# Patient Record
Sex: Male | Born: 1965
Health system: Southern US, Community
[De-identification: ages and names within clinical notes are randomized; demographics above are authoritative.]

## PROBLEM LIST (undated history)

## (undated) DIAGNOSIS — I2699 Other pulmonary embolism without acute cor pulmonale: Secondary | ICD-10-CM

## (undated) DIAGNOSIS — I82409 Acute embolism and thrombosis of unspecified deep veins of unspecified lower extremity: Secondary | ICD-10-CM

## (undated) DIAGNOSIS — I10 Essential (primary) hypertension: Secondary | ICD-10-CM

## (undated) DIAGNOSIS — D6851 Activated protein C resistance: Secondary | ICD-10-CM

## (undated) DIAGNOSIS — G40909 Epilepsy, unspecified, not intractable, without status epilepticus: Secondary | ICD-10-CM

## (undated) HISTORY — DX: Activated protein C resistance: D68.51

## (undated) HISTORY — DX: Acute embolism and thrombosis of unspecified deep veins of unspecified lower extremity: I82.409

## (undated) HISTORY — DX: Essential (primary) hypertension: I10

## (undated) HISTORY — PX: TYMPANOPLASTY: SHX33

## (undated) HISTORY — DX: Epilepsy, unspecified, not intractable, without status epilepticus: G40.909

## (undated) HISTORY — DX: Other pulmonary embolism without acute cor pulmonale: I26.99

---

## 2017-04-22 LAB — HEPATIC FUNCTION PANEL
ALT: 40 (ref 10–40)
AST: 15 (ref 14–40)
Alkaline Phosphatase: 134 — AB (ref 25–125)
Bilirubin, Total: 0.3

## 2017-04-22 LAB — TSH: TSH: 1.67 (ref <=5.90)

## 2017-04-22 LAB — LIPID PANEL
Cholesterol: 207 — AB (ref 0–200)
HDL: 46 (ref 35–70)
LDL Cholesterol: 108
Triglycerides: 265 — AB (ref 40–160)

## 2017-04-22 LAB — CBC AND DIFFERENTIAL
HCT: 43 (ref 41–53)
Hemoglobin: 15 (ref 13.5–17.5)
Platelets: 207 (ref 150–399)
WBC: 6.6

## 2017-04-22 LAB — BASIC METABOLIC PANEL
Potassium: 4.1 (ref 3.4–5.3)
Sodium: 139 (ref 137–147)

## 2017-04-22 LAB — PSA: PSA: 1

## 2017-10-24 DIAGNOSIS — D689 Coagulation defect, unspecified: Secondary | ICD-10-CM | POA: Diagnosis not present

## 2017-10-28 DIAGNOSIS — D689 Coagulation defect, unspecified: Secondary | ICD-10-CM | POA: Diagnosis not present

## 2017-12-07 DIAGNOSIS — D689 Coagulation defect, unspecified: Secondary | ICD-10-CM | POA: Diagnosis not present

## 2018-01-11 DIAGNOSIS — Z7901 Long term (current) use of anticoagulants: Secondary | ICD-10-CM | POA: Diagnosis not present

## 2018-01-11 DIAGNOSIS — I10 Essential (primary) hypertension: Secondary | ICD-10-CM | POA: Diagnosis not present

## 2018-01-11 DIAGNOSIS — M1712 Unilateral primary osteoarthritis, left knee: Secondary | ICD-10-CM | POA: Diagnosis not present

## 2018-01-11 DIAGNOSIS — M7122 Synovial cyst of popliteal space [Baker], left knee: Secondary | ICD-10-CM | POA: Diagnosis not present

## 2018-03-15 DIAGNOSIS — Z86718 Personal history of other venous thrombosis and embolism: Secondary | ICD-10-CM | POA: Diagnosis not present

## 2018-03-15 DIAGNOSIS — Z114 Encounter for screening for human immunodeficiency virus [HIV]: Secondary | ICD-10-CM | POA: Diagnosis not present

## 2018-03-15 DIAGNOSIS — M25562 Pain in left knee: Secondary | ICD-10-CM | POA: Diagnosis not present

## 2018-03-15 DIAGNOSIS — Z0001 Encounter for general adult medical examination with abnormal findings: Secondary | ICD-10-CM | POA: Diagnosis not present

## 2018-03-15 DIAGNOSIS — I10 Essential (primary) hypertension: Secondary | ICD-10-CM | POA: Diagnosis not present

## 2018-03-15 DIAGNOSIS — E785 Hyperlipidemia, unspecified: Secondary | ICD-10-CM | POA: Diagnosis not present

## 2018-03-15 DIAGNOSIS — Z125 Encounter for screening for malignant neoplasm of prostate: Secondary | ICD-10-CM | POA: Diagnosis not present

## 2018-03-15 LAB — LIPID PANEL
Cholesterol: 228 — AB (ref 0–200)
HDL: 53 (ref 35–70)
LDL CALC: 124
Triglycerides: 255 — AB (ref 40–160)

## 2018-03-15 LAB — CBC AND DIFFERENTIAL
HEMATOCRIT: 44 (ref 41–53)
Hemoglobin: 14.9 (ref 13.5–17.5)
Platelets: 212 (ref 150–399)
WBC: 6.2

## 2018-03-15 LAB — HEPATIC FUNCTION PANEL
ALT: 44 — AB (ref 10–40)
AST: 19 (ref 14–40)
Alkaline Phosphatase: 146 — AB (ref 25–125)
Bilirubin, Total: 0.3

## 2018-03-15 LAB — BASIC METABOLIC PANEL
BUN: 12 (ref 4–21)
Creatinine: 0.9 (ref <=1.3)
Glucose: 86
Potassium: 4.2 (ref 3.4–5.3)
Sodium: 140 (ref 137–147)

## 2018-03-15 LAB — HIV ANTIBODY (ROUTINE TESTING W REFLEX): HIV 1&2 Ab, 4th Generation: NEGATIVE

## 2018-03-15 LAB — TSH: TSH: 1.54 (ref <=5.90)

## 2018-03-16 DIAGNOSIS — D689 Coagulation defect, unspecified: Secondary | ICD-10-CM | POA: Diagnosis not present

## 2018-04-24 DIAGNOSIS — D689 Coagulation defect, unspecified: Secondary | ICD-10-CM | POA: Diagnosis not present

## 2018-04-25 DIAGNOSIS — Z86718 Personal history of other venous thrombosis and embolism: Secondary | ICD-10-CM | POA: Diagnosis not present

## 2018-04-25 DIAGNOSIS — Z5181 Encounter for therapeutic drug level monitoring: Secondary | ICD-10-CM | POA: Diagnosis not present

## 2018-04-25 DIAGNOSIS — Z7901 Long term (current) use of anticoagulants: Secondary | ICD-10-CM | POA: Diagnosis not present

## 2018-04-26 DIAGNOSIS — H2513 Age-related nuclear cataract, bilateral: Secondary | ICD-10-CM | POA: Diagnosis not present

## 2018-04-26 DIAGNOSIS — H35033 Hypertensive retinopathy, bilateral: Secondary | ICD-10-CM | POA: Diagnosis not present

## 2018-04-26 DIAGNOSIS — H524 Presbyopia: Secondary | ICD-10-CM | POA: Diagnosis not present

## 2018-05-08 DIAGNOSIS — Z86711 Personal history of pulmonary embolism: Secondary | ICD-10-CM | POA: Diagnosis not present

## 2018-05-08 DIAGNOSIS — Z7901 Long term (current) use of anticoagulants: Secondary | ICD-10-CM | POA: Diagnosis not present

## 2018-05-08 DIAGNOSIS — Z5181 Encounter for therapeutic drug level monitoring: Secondary | ICD-10-CM | POA: Diagnosis not present

## 2018-05-08 DIAGNOSIS — Z86718 Personal history of other venous thrombosis and embolism: Secondary | ICD-10-CM | POA: Diagnosis not present

## 2018-05-09 DIAGNOSIS — M25662 Stiffness of left knee, not elsewhere classified: Secondary | ICD-10-CM | POA: Diagnosis not present

## 2018-05-09 DIAGNOSIS — M25562 Pain in left knee: Secondary | ICD-10-CM | POA: Diagnosis not present

## 2018-05-15 DIAGNOSIS — M7742 Metatarsalgia, left foot: Secondary | ICD-10-CM | POA: Diagnosis not present

## 2018-05-15 DIAGNOSIS — M2041 Other hammer toe(s) (acquired), right foot: Secondary | ICD-10-CM | POA: Diagnosis not present

## 2018-05-15 DIAGNOSIS — M7741 Metatarsalgia, right foot: Secondary | ICD-10-CM | POA: Diagnosis not present

## 2018-05-29 DIAGNOSIS — Z86718 Personal history of other venous thrombosis and embolism: Secondary | ICD-10-CM | POA: Diagnosis not present

## 2018-05-29 DIAGNOSIS — Z5181 Encounter for therapeutic drug level monitoring: Secondary | ICD-10-CM | POA: Diagnosis not present

## 2018-05-29 DIAGNOSIS — Z7901 Long term (current) use of anticoagulants: Secondary | ICD-10-CM | POA: Diagnosis not present

## 2018-06-29 DIAGNOSIS — Z7901 Long term (current) use of anticoagulants: Secondary | ICD-10-CM | POA: Diagnosis not present

## 2018-06-29 DIAGNOSIS — Z86711 Personal history of pulmonary embolism: Secondary | ICD-10-CM | POA: Diagnosis not present

## 2018-06-29 DIAGNOSIS — Z5181 Encounter for therapeutic drug level monitoring: Secondary | ICD-10-CM | POA: Diagnosis not present

## 2018-06-29 DIAGNOSIS — Z86718 Personal history of other venous thrombosis and embolism: Secondary | ICD-10-CM | POA: Diagnosis not present

## 2018-07-04 DIAGNOSIS — Z86711 Personal history of pulmonary embolism: Secondary | ICD-10-CM | POA: Diagnosis not present

## 2018-07-04 DIAGNOSIS — D6851 Activated protein C resistance: Secondary | ICD-10-CM | POA: Diagnosis not present

## 2018-07-04 DIAGNOSIS — Z7901 Long term (current) use of anticoagulants: Secondary | ICD-10-CM | POA: Diagnosis not present

## 2018-07-04 DIAGNOSIS — Z5181 Encounter for therapeutic drug level monitoring: Secondary | ICD-10-CM | POA: Diagnosis not present

## 2018-07-18 DIAGNOSIS — M2042 Other hammer toe(s) (acquired), left foot: Secondary | ICD-10-CM | POA: Diagnosis not present

## 2018-07-18 DIAGNOSIS — M2041 Other hammer toe(s) (acquired), right foot: Secondary | ICD-10-CM | POA: Diagnosis not present

## 2018-07-18 DIAGNOSIS — M7741 Metatarsalgia, right foot: Secondary | ICD-10-CM | POA: Diagnosis not present

## 2018-07-18 DIAGNOSIS — Z7901 Long term (current) use of anticoagulants: Secondary | ICD-10-CM | POA: Diagnosis not present

## 2018-07-18 DIAGNOSIS — M7742 Metatarsalgia, left foot: Secondary | ICD-10-CM | POA: Diagnosis not present

## 2018-07-18 DIAGNOSIS — Z5181 Encounter for therapeutic drug level monitoring: Secondary | ICD-10-CM | POA: Diagnosis not present

## 2018-07-18 DIAGNOSIS — Z86718 Personal history of other venous thrombosis and embolism: Secondary | ICD-10-CM | POA: Diagnosis not present

## 2018-08-10 DIAGNOSIS — Z7901 Long term (current) use of anticoagulants: Secondary | ICD-10-CM | POA: Diagnosis not present

## 2018-08-10 DIAGNOSIS — Z5181 Encounter for therapeutic drug level monitoring: Secondary | ICD-10-CM | POA: Diagnosis not present

## 2018-08-10 DIAGNOSIS — Z86718 Personal history of other venous thrombosis and embolism: Secondary | ICD-10-CM | POA: Diagnosis not present

## 2018-09-21 DIAGNOSIS — Z5181 Encounter for therapeutic drug level monitoring: Secondary | ICD-10-CM | POA: Diagnosis not present

## 2018-09-21 DIAGNOSIS — Z86718 Personal history of other venous thrombosis and embolism: Secondary | ICD-10-CM | POA: Diagnosis not present

## 2018-09-21 DIAGNOSIS — Z7901 Long term (current) use of anticoagulants: Secondary | ICD-10-CM | POA: Diagnosis not present

## 2018-10-16 ENCOUNTER — Ambulatory Visit: Payer: Self-pay | Admitting: Adult Health

## 2018-10-16 DIAGNOSIS — N529 Male erectile dysfunction, unspecified: Secondary | ICD-10-CM | POA: Diagnosis not present

## 2018-10-16 DIAGNOSIS — R569 Unspecified convulsions: Secondary | ICD-10-CM | POA: Diagnosis not present

## 2018-10-16 DIAGNOSIS — I10 Essential (primary) hypertension: Secondary | ICD-10-CM | POA: Diagnosis not present

## 2018-10-16 DIAGNOSIS — G40409 Other generalized epilepsy and epileptic syndromes, not intractable, without status epilepticus: Secondary | ICD-10-CM | POA: Diagnosis not present

## 2018-10-16 DIAGNOSIS — Z5181 Encounter for therapeutic drug level monitoring: Secondary | ICD-10-CM | POA: Diagnosis not present

## 2018-10-16 DIAGNOSIS — E785 Hyperlipidemia, unspecified: Secondary | ICD-10-CM | POA: Diagnosis not present

## 2018-10-16 DIAGNOSIS — Z7901 Long term (current) use of anticoagulants: Secondary | ICD-10-CM | POA: Diagnosis not present

## 2018-10-16 DIAGNOSIS — Z86718 Personal history of other venous thrombosis and embolism: Secondary | ICD-10-CM | POA: Diagnosis not present

## 2018-10-16 DIAGNOSIS — Z125 Encounter for screening for malignant neoplasm of prostate: Secondary | ICD-10-CM | POA: Diagnosis not present

## 2018-10-19 NOTE — Progress Notes (Signed)
Subjective:    Patient ID: Caleb Kirby, male    DOB: [birth year], 53 y.o.   MRN: [id number]  HPI:  Caleb Kirby is here to establish as a new pt.  He is a pleasant 53 year old male. PMH: HTN, HLD, Obesity, Factor V Leiden, Epilepsy, and Depression He served 8 years AD in [place] [employer] all chronic conditions managed by various [facility] providers. He reports 4 DVT in LLE and one PE in last 10 years. He has been on Xarelto and Lovenox in past, currently on Coumadin for anti-coagulation.  He is closely followed by [facility] Hematology monthly. His [facility] PCP manages HTN, HLD, Obesity, Epilepsy [facility] manages depression and PTSD r/t to [place] [employer]. He denies thoughts of harming himself/others He estimates to drink one beer/week He walks/rides bike "all day when I work at [employer]", >5K steps/day- M-F, however his 288 lns, Body mass index is 39.98 kg/m.  He reports nighttime snacking on CHO/sugar- last A1c per [facility] labs 2018- 5.4 He denies formal exercise program He denies tobacco/vape use He is agreeable to annual CPE and 6 month f/u, and will use use for acute issues- he will use established [facility] providers for chronic conditions He estimates to drink 50-60 oz plain water/day He is married, plans on retiring from [employer] in a few years.  Patient Care Team    Relationship Specialty Notifications Start End  Julaine Fusi, NP PCP - General Family Medicine  10/30/18     Patient Active Problem List   Diagnosis Date Noted  . HTN, goal below 130/80 10/30/2018  . Hypercholesteremia 10/30/2018  . Nonintractable epilepsy without status epilepticus (HCC) 10/30/2018  . Healthcare maintenance 10/30/2018  . Depression, recurrent (HCC) 10/30/2018  . BMI 39.0-39.9,adult 10/30/2018     Past Medical History:  Diagnosis Date  . DVT, lower extremity (HCC)   . Epilepsy (HCC)   . Factor V Leiden (HCC)   . Hypertension   . Pulmonary embolism [facility])      Past Surgical History:  Procedure Laterality Date  .  TYMPANOPLASTY       Family History  Problem Relation Age of Onset  . Heart attack Mother   . Alcohol abuse Father   . Cancer Sister        GI  . Cancer Brother        colon  . Cancer Maternal Grandmother        metastatic     Social History   Substance and Sexual Activity  Drug Use Not Currently     Social History   Substance and Sexual Activity  Alcohol Use Yes  . Alcohol/week: 1.0 standard drinks  . Types: 1 Cans of beer per week     Social History   Tobacco Use  Smoking Status Former Smoker  . Packs/day: 1.00  . Years: 20.00  . Pack years: 20.00  . Types: Cigarettes  . Last attempt to quit: 07/21/1997  . Years since quitting: 21.2  Smokeless Tobacco Never Used     Outpatient Encounter Medications as of 10/30/2018  Medication Sig  . atorvastatin (LIPITOR) 10 MG tablet Take 1 tablet by mouth daily.  . hydrochlorothiazide (HYDRODIURIL) 25 MG tablet Take 1 tablet by mouth daily.  Marland Kitchen losartan (COZAAR) 50 MG tablet Take 25 mg by mouth daily.  . phenytoin (DILANTIN) 200 MG ER capsule Take 200 mg by mouth 2 (two) times daily.  Marland Kitchen venlafaxine XR (EFFEXOR-XR) 150 MG 24 hr capsule Take 150 mg by  mouth daily with breakfast.  . warfarin (COUMADIN) 5 MG tablet Take 7.5 mg by mouth daily.   No facility-administered encounter medications on file as of 10/30/2018.     Allergies: Patient has no known allergies.  Body mass index is 39.98 kg/m.  Blood pressure 128/87, pulse 79, temperature 98.5 F (36.9 C), temperature source Oral, height 5' 11.25" (1.81 m), weight 288 lb 11.2 oz (131 kg), SpO2 96 %.  Review of Systems  Constitutional: Positive for fatigue. Negative for activity change, appetite change, chills, diaphoresis, fever and unexpected weight change.  HENT: Negative for congestion.   Eyes: Negative for visual disturbance.  Respiratory: Negative for cough, chest tightness, shortness of breath, wheezing and stridor.   Cardiovascular: Negative for chest pain,  palpitations and leg swelling.  Gastrointestinal: Negative for abdominal distention, abdominal pain, blood in stool, constipation, diarrhea, nausea and vomiting.  Genitourinary: Negative for difficulty urinating and flank pain.  Musculoskeletal: Negative for arthralgias, back pain, gait problem, joint swelling, myalgias, neck pain and neck stiffness.  Skin: Negative for color change, pallor, rash and wound.  Neurological: Negative for dizziness, seizures, weakness and headaches.  Hematological: Does not bruise/bleed easily.  Psychiatric/Behavioral: Negative for agitation, behavioral problems, confusion, decreased concentration, dysphoric mood, hallucinations, self-injury, sleep disturbance and suicidal ideas. The patient is not nervous/anxious and is not hyperactive.        Objective:   Physical Exam Vitals signs and nursing note reviewed.  Constitutional:      General: He is not in acute distress.    Appearance: He is obese. He is not ill-appearing, toxic-appearing or diaphoretic.  HENT:     Head: Normocephalic and atraumatic.  Cardiovascular:     Rate and Rhythm: Normal rate.     Pulses: Normal pulses.     Heart sounds: Normal heart sounds. No murmur. No friction rub. No gallop.   Pulmonary:     Effort: Pulmonary effort is normal. No respiratory distress.     Breath sounds: No stridor. No wheezing, rhonchi or rales.  Chest:     Chest wall: No tenderness.  Skin:    Capillary Refill: Capillary refill takes less than 2 seconds.  Neurological:     Mental Status: He is alert and oriented to person, place, and time.  Psychiatric:        Mood and Affect: Mood normal.        Behavior: Behavior normal.        Thought Content: Thought content normal.        Judgment: Judgment normal.       Assessment & Plan:   1. Hypercholesteremia   2. HTN, goal below 130/80   3. Nonintractable epilepsy without status epilepticus, unspecified epilepsy type (HCC)   4. Healthcare maintenance   5.  Depression, recurrent (HCC)   6. BMI 39.0-39.9,adult     Healthcare maintenance Continue all medications as directed. Continue regular follow-up with various VA providers. Increase water intake, strive for at least 100 ounces/day.   Follow Mediterranean diet Increase regular exercise.  Recommend at least 30 minutes daily, 5 days per week of walking, biking, swimming, YouTube/Pinterest workout videos. Please schedule complete physical in 3 months- do not need labs- have recent results from [place]. Please bring your colonoscopy results to physical.  HTN, goal below 130/80 BP at goal 128/87, HR 79 Continue Losartan 25mg  QD, HCTZ 25mg  QD  Hypercholesteremia Mediterranean Diet Increase regular exercise Continue Atorvastatin 10mg  QD  Depression, recurrent (HCC) Continue with quarterly VA mental health visits and  Venlafaxine 150mg  QD Denies thoughts of harming himself/others   Nonintractable epilepsy without status epilepticus (HCC) Denies any recent seizure activity Continue phenytoin 200mg  BID    FOLLOW-UP:  Return in about 3 months (around 01/28/2019) for CPE.

## 2018-10-26 DIAGNOSIS — R159 Full incontinence of feces: Secondary | ICD-10-CM | POA: Diagnosis not present

## 2018-10-26 DIAGNOSIS — Z86010 Personal history of colonic polyps: Secondary | ICD-10-CM | POA: Diagnosis not present

## 2018-10-26 DIAGNOSIS — Z800 Family history of malignant neoplasm of digestive organs: Secondary | ICD-10-CM | POA: Diagnosis not present

## 2018-10-26 DIAGNOSIS — R194 Change in bowel habit: Secondary | ICD-10-CM | POA: Diagnosis not present

## 2018-10-30 ENCOUNTER — Encounter: Payer: Self-pay | Admitting: Adult Health

## 2018-10-30 ENCOUNTER — Ambulatory Visit: Payer: Federal, State, Local not specified - PPO | Admitting: Adult Health

## 2018-10-30 VITALS — BP 128/87 | HR 79 | Temp 98.5°F | Ht 71.25 in | Wt 288.7 lb

## 2018-10-30 DIAGNOSIS — Z0000 Encounter for general adult medical examination without abnormal findings: Secondary | ICD-10-CM | POA: Diagnosis not present

## 2018-10-30 DIAGNOSIS — I10 Essential (primary) hypertension: Secondary | ICD-10-CM

## 2018-10-30 DIAGNOSIS — Z6839 Body mass index (BMI) 39.0-39.9, adult: Secondary | ICD-10-CM

## 2018-10-30 DIAGNOSIS — G40909 Epilepsy, unspecified, not intractable, without status epilepticus: Secondary | ICD-10-CM

## 2018-10-30 DIAGNOSIS — E7800 Pure hypercholesterolemia, unspecified: Secondary | ICD-10-CM | POA: Diagnosis not present

## 2018-10-30 DIAGNOSIS — F339 Major depressive disorder, recurrent, unspecified: Secondary | ICD-10-CM

## 2018-10-30 NOTE — Assessment & Plan Note (Signed)
Mediterranean Diet Increase regular exercise Continue Atorvastatin 10mg  QD

## 2018-10-30 NOTE — Assessment & Plan Note (Signed)
BP at goal 128/87, HR 79 Continue Losartan 25mg  QD, HCTZ 25mg  QD

## 2018-10-30 NOTE — Patient Instructions (Signed)
Mediterranean Diet A Mediterranean diet refers to food and lifestyle choices that are based on the traditions of countries located on the [employer]. This way of eating has been shown to help prevent certain conditions and improve outcomes for people who have chronic diseases, like kidney disease and heart disease. What are tips for following this plan? Lifestyle  Cook and eat meals together with your family, when possible.  Drink enough fluid to keep your urine clear or pale yellow.  Be physically active every day. This includes: ? Aerobic exercise like running or swimming. ? Leisure activities like gardening, walking, or housework.  Get 7-8 hours of sleep each night.  If recommended by your health care provider, drink red wine in moderation. This means 1 glass a day for nonpregnant women and 2 glasses a day for men. A glass of wine equals 5 oz (150 mL). Reading food labels   Check the serving size of packaged foods. For foods such as rice and pasta, the serving size refers to the amount of cooked product, not dry.  Check the total fat in packaged foods. Avoid foods that have saturated fat or trans fats.  Check the ingredients list for added sugars, such as corn syrup. Shopping  At the grocery store, buy most of your food from the areas near the walls of the store. This includes: ? Fresh fruits and vegetables (produce). ? Grains, beans, nuts, and seeds. Some of these may be available in unpackaged forms or large amounts (in bulk). ? Fresh seafood. ? Poultry and eggs. ? Low-fat dairy products.  Buy whole ingredients instead of prepackaged foods.  Buy fresh fruits and vegetables in-season from local farmers markets.  Buy frozen fruits and vegetables in resealable bags.  If you do not have access to quality fresh seafood, buy precooked frozen shrimp or canned fish, such as tuna, salmon, or sardines.  Buy small amounts of raw or cooked vegetables, salads, or olives from  the deli or salad bar at your store.  Stock your pantry so you always have certain foods on hand, such as olive oil, canned tuna, canned tomatoes, rice, pasta, and beans. Cooking  Cook foods with extra-virgin olive oil instead of using butter or other vegetable oils.  Have meat as a side dish, and have vegetables or grains as your main dish. This means having meat in small portions or adding small amounts of meat to foods like pasta or stew.  Use beans or vegetables instead of meat in common dishes like chili or lasagna.  Experiment with different cooking methods. Try roasting or broiling vegetables instead of steaming or sauteing them.  Add frozen vegetables to soups, stews, pasta, or rice.  Add nuts or seeds for added healthy fat at each meal. You can add these to yogurt, salads, or vegetable dishes.  Marinate fish or vegetables using olive oil, lemon juice, garlic, and fresh herbs. Meal planning   Plan to eat 1 vegetarian meal one day each week. Try to work up to 2 vegetarian meals, if possible.  Eat seafood 2 or more times a week.  Have healthy snacks readily available, such as: ? Vegetable sticks with hummus. ? [place] yogurt. ? Fruit and nut trail mix.  Eat balanced meals throughout the week. This includes: ? Fruit: 2-3 servings a day ? Vegetables: 4-5 servings a day ? Low-fat dairy: 2 servings a day ? Fish, poultry, or lean meat: 1 serving a day ? Beans and legumes: 2 or more servings a week ?  Nuts and seeds: 1-2 servings a day ? Whole grains: 6-8 servings a day ? Extra-virgin olive oil: 3-4 servings a day  Limit red meat and sweets to only a few servings a month What are my food choices?  Mediterranean diet ? Recommended ? Grains: Whole-grain pasta. Brown rice. Bulgar wheat. Polenta. Couscous. Whole-wheat bread. Orpah Cobb. ? Vegetables: Artichokes. Beets. Broccoli. Cabbage. Carrots. Eggplant. Green beans. Chard. Kale. Spinach. Onions. Leeks. Peas. Squash.  Tomatoes. Peppers. Radishes. ? Fruits: Apples. Apricots. Avocado. Berries. Bananas. Cherries. Dates. Figs. Grapes. Lemons. Melon. Oranges. Peaches. Plums. Pomegranate. ? Meats and other protein foods: Beans. Almonds. Sunflower seeds. Pine nuts. Peanuts. Cod. Salmon. Scallops. Shrimp. Tuna. Tilapia. Clams. Oysters. Eggs. ? Dairy: Low-fat milk. Cheese. Greek yogurt. ? Beverages: Water. Red wine. Herbal tea. ? Fats and oils: Extra virgin olive oil. Avocado oil. Grape seed oil. ? Sweets and desserts: [place] yogurt with honey. Baked apples. Poached pears. Trail mix. ? Seasoning and other foods: Basil. Cilantro. Coriander. Cumin. Mint. Parsley. Sage. Rosemary. Tarragon. Garlic. Oregano. Thyme. Pepper. Balsalmic vinegar. Tahini. Hummus. Tomato sauce. Olives. Mushrooms. ? Limit these ? Grains: Prepackaged pasta or rice dishes. Prepackaged cereal with added sugar. ? Vegetables: Deep fried potatoes (french fries). ? Fruits: Fruit canned in syrup. ? Meats and other protein foods: Beef. Pork. Lamb. Poultry with skin. Hot dogs. Tomasa Blase. ? Dairy: Ice cream. Sour cream. Whole milk. ? Beverages: Juice. Sugar-sweetened soft drinks. Beer. Liquor and spirits. ? Fats and oils: Butter. Canola oil. Vegetable oil. Beef fat (tallow). Lard. ? Sweets and desserts: Cookies. Cakes. Pies. Candy. ? Seasoning and other foods: Mayonnaise. Premade sauces and marinades. ? The items listed may not be a complete list. Talk with your dietitian about what dietary choices are right for you. Summary  The Mediterranean diet includes both food and lifestyle choices.  Eat a variety of fresh fruits and vegetables, beans, nuts, seeds, and whole grains.  Limit the amount of red meat and sweets that you eat.  Talk with your health care provider about whether it is safe for you to drink red wine in moderation. This means 1 glass a day for nonpregnant women and 2 glasses a day for men. A glass of wine equals 5 oz (150 mL). This information  is not intended to replace advice given to you by your health care provider. Make sure you discuss any questions you have with your health care provider. Document Released: 04/29/2016 Document Revised: 06/01/2016 Document Reviewed: 04/29/2016 Elsevier Interactive Patient Education  2019 [employer].  Continue all medications as directed. Continue regular follow-up with various VA providers. Increase water intake, strive for at least 100 ounces/day.   Follow Mediterranean diet Increase regular exercise.  Recommend at least 30 minutes daily, 5 days per week of walking, biking, swimming, YouTube/Pinterest workout videos. Please schedule complete physical in 3 months- do not need labs- have recent results from [place]. Please bring your colonoscopy results to physical. WELCOME TO THE PRACTICE!

## 2018-10-30 NOTE — Assessment & Plan Note (Signed)
Continue all medications as directed. Continue regular follow-up with various [facility] providers. Increase water intake, strive for at least 100 ounces/day.   Follow Mediterranean diet Increase regular exercise.  Recommend at least 30 minutes daily, 5 days per week of walking, biking, swimming, YouTube/Pinterest workout videos. Please schedule complete physical in 3 months- do not need labs- have recent results from [place]. Please bring your colonoscopy results to physical.

## 2018-10-30 NOTE — Assessment & Plan Note (Signed)
Continue with quarterly VA mental health visits and Venlafaxine 150mg  QD Denies thoughts of harming himself/others

## 2018-10-30 NOTE — Assessment & Plan Note (Signed)
Denies any recent seizure activity Continue phenytoin 200mg  BID

## 2018-11-21 DIAGNOSIS — Z5181 Encounter for therapeutic drug level monitoring: Secondary | ICD-10-CM | POA: Diagnosis not present

## 2018-11-21 DIAGNOSIS — Z86711 Personal history of pulmonary embolism: Secondary | ICD-10-CM | POA: Diagnosis not present

## 2018-11-21 DIAGNOSIS — D682 Hereditary deficiency of other clotting factors: Secondary | ICD-10-CM | POA: Diagnosis not present

## 2018-11-21 DIAGNOSIS — Z7901 Long term (current) use of anticoagulants: Secondary | ICD-10-CM | POA: Diagnosis not present

## 2018-12-04 ENCOUNTER — Encounter: Payer: Self-pay | Admitting: Adult Health

## 2018-12-04 ENCOUNTER — Ambulatory Visit: Payer: Federal, State, Local not specified - PPO | Admitting: Adult Health

## 2018-12-04 ENCOUNTER — Other Ambulatory Visit: Payer: Self-pay

## 2018-12-04 VITALS — BP 125/84 | HR 76 | Temp 98.9°F | Ht 71.25 in | Wt 292.3 lb

## 2018-12-04 DIAGNOSIS — H6121 Impacted cerumen, right ear: Secondary | ICD-10-CM | POA: Diagnosis not present

## 2018-12-04 NOTE — Progress Notes (Signed)
Subjective:    Patient ID: ACY OBARA, male    DOB: June 12, 1966, 53 y.o.   MRN: [id number]  HPI:  Ms. Leyton presents with R otalgia that began 3 weeks ago after swimming while on a cruise ship He denies acute injury/accident to R ear or R side of head He reports decreased hearing in R ear and ear pressure, pain rated 2/10 He has been using OTC Debriox He has hx of re-current excessive ear cerumen He had his R eardrum "replaced when I was age 78" He denies cough/nasal drainage He denies fever/night sweats/chills He denies dizziness/imbalance issues He estimates to drink 3-4 16 ox bottles of water He denies tobacco/vape use He reports waking with stiff neck and frontal HA this morning, both sx's were brief and have self resolved   Patient Care Team    Relationship Specialty Notifications Start End  Julaine Fusi, NP PCP - General Family Medicine  10/30/18     Patient Active Problem List   Diagnosis Date Noted  . Hearing loss of right ear due to cerumen impaction 12/04/2018  . HTN, goal below 130/80 10/30/2018  . Hypercholesteremia 10/30/2018  . Nonintractable epilepsy without status epilepticus (HCC) 10/30/2018  . Healthcare maintenance 10/30/2018  . Depression, recurrent (HCC) 10/30/2018  . BMI 39.0-39.9,adult 10/30/2018     Past Medical History:  Diagnosis Date  . DVT, lower extremity (HCC)   . Epilepsy (HCC)   . Factor V Leiden (HCC)   . Hypertension   . Pulmonary embolism [facility])      Past Surgical History:  Procedure Laterality Date  . TYMPANOPLASTY       Family History  Problem Relation Age of Onset  . Heart attack Mother   . Alcohol abuse Father   . Cancer Sister        GI  . Cancer Brother        colon  . Cancer Maternal Grandmother        metastatic     Social History   Substance and Sexual Activity  Drug Use Not Currently     Social History   Substance and Sexual Activity  Alcohol Use Yes  . Alcohol/week: 1.0 standard drinks  .  Types: 1 Cans of beer per week     Social History   Tobacco Use  Smoking Status Former Smoker  . Packs/day: 1.00  . Years: 20.00  . Pack years: 20.00  . Types: Cigarettes  . Last attempt to quit: 07/21/1997  . Years since quitting: 21.3  Smokeless Tobacco Never Used     Outpatient Encounter Medications as of 12/04/2018  Medication Sig  . atorvastatin (LIPITOR) 10 MG tablet Take 1 tablet by mouth daily.  . hydrochlorothiazide (HYDRODIURIL) 25 MG tablet Take 1 tablet by mouth daily.  Marland Kitchen losartan (COZAAR) 50 MG tablet Take 25 mg by mouth daily.  . phenytoin (DILANTIN) 200 MG ER capsule Take 200 mg by mouth 2 (two) times daily.  Marland Kitchen venlafaxine XR (EFFEXOR-XR) 150 MG 24 hr capsule Take 150 mg by mouth daily with breakfast.  . warfarin (COUMADIN) 5 MG tablet Take 7.5 mg by mouth daily.   No facility-administered encounter medications on file as of 12/04/2018.     Allergies: Patient has no known allergies.  Body mass index is 40.48 kg/m.  Blood pressure 125/84, pulse 76, temperature 98.9 F (37.2 C), temperature source Oral, height 5' 11.25" (1.81 m), weight 292 lb 4.8 oz (132.6 kg), SpO2 96 %.  Review of Systems  Constitutional: Positive for fatigue. Negative for activity change, appetite change, chills, diaphoresis, fever and unexpected weight change.  HENT: Positive for ear pain and hearing loss. Negative for congestion, ear discharge, facial swelling, postnasal drip, rhinorrhea, sinus pressure, sinus pain, sneezing, sore throat, trouble swallowing and voice change.   Eyes: Negative for visual disturbance.  Respiratory: Negative for cough, chest tightness, shortness of breath, wheezing and stridor.   Cardiovascular: Negative for chest pain, palpitations and leg swelling.  Endocrine: Negative for cold intolerance, heat intolerance, polydipsia, polyphagia and polyuria.  Neurological: Negative for dizziness and headaches.  Hematological: Does not bruise/bleed easily.        Objective:   Physical Exam Vitals signs and nursing note reviewed.  Constitutional:      General: He is not in acute distress.    Appearance: He is not ill-appearing, toxic-appearing or diaphoretic.  HENT:     Head: Normocephalic and atraumatic.     Right Ear: External ear normal. Decreased hearing noted. No drainage. There is impacted cerumen. No mastoid tenderness.     Left Ear: External ear normal. No decreased hearing noted. No drainage. No mastoid tenderness. Tympanic membrane is not erythematous or bulging.     Ears:     Comments: R ear- Unable to visualize TM- completely impacted with cerumen No pain with tragus movement  Pt unable to tolerate ear flush via Rhino Flush or curette  Mild cerumen noted noted in L ear     Nose: Nose normal.     Mouth/Throat:     Mouth: Mucous membranes are moist.     Pharynx: Posterior oropharyngeal erythema present. No oropharyngeal exudate.  Eyes:     Extraocular Movements: Extraocular movements intact.     Conjunctiva/sclera: Conjunctivae normal.     Pupils: Pupils are equal, round, and reactive to light.  Cardiovascular:     Rate and Rhythm: Normal rate.     Pulses: Normal pulses.     Heart sounds: Normal heart sounds. No murmur. No friction rub. No gallop.   Pulmonary:     Effort: Pulmonary effort is normal. No respiratory distress.     Breath sounds: Normal breath sounds. No stridor. No wheezing, rhonchi or rales.  Chest:     Chest wall: No tenderness.  Skin:    General: Skin is warm and dry.     Capillary Refill: Capillary refill takes less than 2 seconds.  Neurological:     Mental Status: He is alert and oriented to person, place, and time.  Psychiatric:        Mood and Affect: Mood normal.        Behavior: Behavior normal.        Thought Content: Thought content normal.        Judgment: Judgment normal.       Assessment & Plan:   1. Hearing loss of right ear due to cerumen impaction     Hearing loss of right ear due to  cerumen impaction Unable to tolerated  Continue to use OTC Debrox as directed by manufacturer. Referral to ENT placed for cerumen to be removed via suction device. Continue to drink plenty of water and avoiding tobacco products.    FOLLOW-UP:  Return if symptoms worsen or fail to improve.

## 2018-12-04 NOTE — Assessment & Plan Note (Signed)
Unable to tolerated  Continue to use OTC Debrox as directed by manufacturer. Referral to ENT placed for cerumen to be removed via suction device. Continue to drink plenty of water and avoiding tobacco products.

## 2018-12-04 NOTE — Patient Instructions (Signed)
Earwax Buildup, Adult The ears produce a substance called earwax that helps keep bacteria out of the ear and protects the skin in the ear canal. Occasionally, earwax can build up in the ear and cause discomfort or hearing loss. What increases the risk? This condition is more likely to develop in people who:  Are male.  Are elderly.  Naturally produce more earwax.  Clean their ears often with cotton swabs.  Use earplugs often.  Use in-ear headphones often.  Wear hearing aids.  Have narrow ear canals.  Have earwax that is overly thick or sticky.  Have eczema.  Are dehydrated.  Have excess hair in the ear canal. What are the signs or symptoms? Symptoms of this condition include:  Reduced or muffled hearing.  A feeling of fullness in the ear or feeling that the ear is plugged.  Fluid coming from the ear.  Ear pain.  Ear itch.  Ringing in the ear.  Coughing.  An obvious piece of earwax that can be seen inside the ear canal. How is this diagnosed? This condition may be diagnosed based on:  Your symptoms.  Your medical history.  An ear exam. During the exam, your health care provider will look into your ear with an instrument called an otoscope. You may have tests, including a hearing test. How is this treated? This condition may be treated by:  Using ear drops to soften the earwax.  Having the earwax removed by a health care provider. The health care provider may: ? Flush the ear with water. ? Use an instrument that has a loop on the end (curette). ? Use a suction device.  Surgery to remove the wax buildup. This may be done in severe cases. Follow these instructions at home:   Take over-the-counter and prescription medicines only as told by your health care provider.  Do not put any objects, including cotton swabs, into your ear. You can clean the opening of your ear canal with a washcloth or facial tissue.  Follow instructions from your health care  provider about cleaning your ears. Do not over-clean your ears.  Drink enough fluid to keep your urine clear or pale yellow. This will help to thin the earwax.  Keep all follow-up visits as told by your health care provider. If earwax builds up in your ears often or if you use hearing aids, consider seeing your health care provider for routine, preventive ear cleanings. Ask your health care provider how often you should schedule your cleanings.  If you have hearing aids, clean them according to instructions from the manufacturer and your health care provider. Contact a health care provider if:  You have ear pain.  You develop a fever.  You have blood, pus, or other fluid coming from your ear.  You have hearing loss.  You have ringing in your ears that does not go away.  Your symptoms do not improve with treatment.  You feel like the room is spinning (vertigo). Summary  Earwax can build up in the ear and cause discomfort or hearing loss.  The most common symptoms of this condition include reduced or muffled hearing and a feeling of fullness in the ear or feeling that the ear is plugged.  This condition may be diagnosed based on your symptoms, your medical history, and an ear exam.  This condition may be treated by using ear drops to soften the earwax or by having the earwax removed by a health care provider.  Do not put any   objects, including cotton swabs, into your ear. You can clean the opening of your ear canal with a washcloth or facial tissue. This information is not intended to replace advice given to you by your health care provider. Make sure you discuss any questions you have with your health care provider. Document Released: 10/14/2004 Document Revised: 08/18/2017 Document Reviewed: 11/17/2016 Elsevier Interactive Patient Education  2019 [employer].  Continue to use OTC Debrox as directed by manufacturer. Referral to ENT placed for cerumen to be removed via suction  device. Continue to drink plenty of water and avoiding tobacco products. NICE TO SEE YOU!

## 2018-12-07 DIAGNOSIS — Z5181 Encounter for therapeutic drug level monitoring: Secondary | ICD-10-CM | POA: Diagnosis not present

## 2018-12-07 DIAGNOSIS — Z7901 Long term (current) use of anticoagulants: Secondary | ICD-10-CM | POA: Diagnosis not present

## 2018-12-07 DIAGNOSIS — Z86718 Personal history of other venous thrombosis and embolism: Secondary | ICD-10-CM | POA: Diagnosis not present

## 2019-01-03 DIAGNOSIS — Z7901 Long term (current) use of anticoagulants: Secondary | ICD-10-CM | POA: Diagnosis not present

## 2019-01-03 DIAGNOSIS — D682 Hereditary deficiency of other clotting factors: Secondary | ICD-10-CM | POA: Diagnosis not present

## 2019-01-03 DIAGNOSIS — Z5181 Encounter for therapeutic drug level monitoring: Secondary | ICD-10-CM | POA: Diagnosis not present

## 2019-01-03 DIAGNOSIS — Z86711 Personal history of pulmonary embolism: Secondary | ICD-10-CM | POA: Diagnosis not present

## 2019-01-24 DIAGNOSIS — Z800 Family history of malignant neoplasm of digestive organs: Secondary | ICD-10-CM | POA: Diagnosis not present

## 2019-01-24 DIAGNOSIS — Z86010 Personal history of colonic polyps: Secondary | ICD-10-CM | POA: Diagnosis not present

## 2019-01-24 DIAGNOSIS — R195 Other fecal abnormalities: Secondary | ICD-10-CM | POA: Diagnosis not present

## 2019-01-29 ENCOUNTER — Telehealth: Payer: Self-pay

## 2019-01-29 NOTE — Telephone Encounter (Signed)
Pt called c/o cough with a tickle in his throat x 2 weeks.  Pt denies any fever or SOB.  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  Pt is inquiring about being tested for the coronavirus.  Advised pt that he does not qualify for testing, nor would he likely have the virus, since he has not had a fever nor SOB.  Advised pt that he may be suffering with allergies and should try OTC Zyrtec or Claritin and that if symptoms do not subside within a few days of using OTC meds, or if he develops fever and SOB associated with coughing, to call back.  Pt expressed understanding and is agreeable.  Tiajuana Amass, CMA

## 2019-02-14 ENCOUNTER — Ambulatory Visit: Payer: Federal, State, Local not specified - PPO | Admitting: Adult Health

## 2019-02-14 ENCOUNTER — Encounter: Payer: Self-pay | Admitting: Adult Health

## 2019-02-14 ENCOUNTER — Other Ambulatory Visit: Payer: Self-pay

## 2019-02-14 VITALS — BP 127/90 | HR 76 | Temp 98.3°F | Ht 71.25 in | Wt 286.4 lb

## 2019-02-14 DIAGNOSIS — R1031 Right lower quadrant pain: Secondary | ICD-10-CM | POA: Diagnosis not present

## 2019-02-14 DIAGNOSIS — R319 Hematuria, unspecified: Secondary | ICD-10-CM | POA: Insufficient documentation

## 2019-02-14 DIAGNOSIS — Z0000 Encounter for general adult medical examination without abnormal findings: Secondary | ICD-10-CM | POA: Diagnosis not present

## 2019-02-14 LAB — POCT URINALYSIS DIPSTICK
Bilirubin, UA: NEGATIVE
Glucose, UA: NEGATIVE
Ketones, UA: NEGATIVE
Leukocytes, UA: NEGATIVE
Nitrite, UA: NEGATIVE
Protein, UA: POSITIVE — AB
Spec Grav, UA: 1.025 (ref 1.010–1.025)
Urobilinogen, UA: 0.2 E.U./dL
pH, UA: 5.5 (ref 5.0–8.0)
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
## 2019-02-14 NOTE — Assessment & Plan Note (Addendum)
UA- Blood: Large ++ Referral to Urologist placed

## 2019-02-14 NOTE — Patient Instructions (Addendum)
Flank Pain, Adult Flank pain is pain that is located on the side of the body between the upper abdomen and the back. This area is called the flank. The pain may occur over a short period of time (acute), or it may be long-term or recurring (chronic). It may be mild or severe. Flank pain can be caused by many things, including:  Muscle soreness or injury.  Kidney stones or kidney disease.  Stress.  A disease of the spine (vertebral disk disease).  A lung infection (pneumonia).  Fluid around the lungs (pulmonary edema).  A skin rash caused by the chickenpox virus (shingles).  Tumors that affect the back of the abdomen.  Gallbladder disease. Follow these instructions at home:   Drink enough fluid to keep your urine clear or pale yellow.  Rest as told by your health care provider.  Take over-the-counter and prescription medicines only as told by your health care provider.  Keep a journal to track what has caused your flank pain and what has made it feel better.  Keep all follow-up visits as told by your health care provider. This is important. Contact a health care provider if:  Your pain is not controlled with medicine.  You have new symptoms.  Your pain gets worse.  You have a fever.  Your symptoms last longer than 2-3 days.  You have trouble urinating or you are urinating very frequently. Get help right away if:  You have trouble breathing or you are short of breath.  Your abdomen hurts or it is swollen or red.  You have nausea or vomiting.  You feel faint or you pass out.  You have blood in your urine. Summary  Flank pain is pain that is located on the side of the body between the upper abdomen and the back.  The pain may occur over a short period of time (acute), or it may be long-term or recurring (chronic). It may be mild or severe.  Flank pain can be caused by many things.  Contact your health care provider if your symptoms get worse or they last  longer than 2-3 days. This information is not intended to replace advice given to you by your health care provider. Make sure you discuss any questions you have with your health care provider. Document Released: 10/28/2005 Document Revised: 11/19/2016 Document Reviewed: 11/19/2016 Elsevier Interactive Patient Education  2019 [employer].  Urinalysis essentially normal with exception of blood, no acute infection. Referral placed to Urology. Increase water intake. Continue to social distance and wear a mask in public. NICE TO SEE YOU!

## 2019-02-14 NOTE — Assessment & Plan Note (Signed)
Urinalysis essentially normal with exception of blood, no acute infection. Referral placed to Urology. Increase water intake.

## 2019-02-14 NOTE — Assessment & Plan Note (Signed)
Continue to social distance and wear a mask in public.

## 2019-02-15 DIAGNOSIS — Z5181 Encounter for therapeutic drug level monitoring: Secondary | ICD-10-CM | POA: Diagnosis not present

## 2019-02-15 DIAGNOSIS — Z7901 Long term (current) use of anticoagulants: Secondary | ICD-10-CM | POA: Diagnosis not present

## 2019-02-15 DIAGNOSIS — Z86711 Personal history of pulmonary embolism: Secondary | ICD-10-CM | POA: Diagnosis not present

## 2019-02-15 DIAGNOSIS — Z86718 Personal history of other venous thrombosis and embolism: Secondary | ICD-10-CM | POA: Diagnosis not present

## 2019-02-19 DIAGNOSIS — N5201 Erectile dysfunction due to arterial insufficiency: Secondary | ICD-10-CM | POA: Diagnosis not present

## 2019-02-19 DIAGNOSIS — R311 Benign essential microscopic hematuria: Secondary | ICD-10-CM | POA: Diagnosis not present

## 2019-02-19 DIAGNOSIS — Z125 Encounter for screening for malignant neoplasm of prostate: Secondary | ICD-10-CM | POA: Diagnosis not present

## 2019-03-02 DIAGNOSIS — R311 Benign essential microscopic hematuria: Secondary | ICD-10-CM | POA: Diagnosis not present

## 2019-03-02 DIAGNOSIS — N5201 Erectile dysfunction due to arterial insufficiency: Secondary | ICD-10-CM | POA: Diagnosis not present

## 2019-03-16 DIAGNOSIS — N2889 Other specified disorders of kidney and ureter: Secondary | ICD-10-CM | POA: Diagnosis not present

## 2019-03-16 DIAGNOSIS — R311 Benign essential microscopic hematuria: Secondary | ICD-10-CM | POA: Diagnosis not present

## 2019-03-30 DIAGNOSIS — Z5181 Encounter for therapeutic drug level monitoring: Secondary | ICD-10-CM | POA: Diagnosis not present

## 2019-03-30 DIAGNOSIS — Z7901 Long term (current) use of anticoagulants: Secondary | ICD-10-CM | POA: Diagnosis not present

## 2019-03-30 DIAGNOSIS — Z86718 Personal history of other venous thrombosis and embolism: Secondary | ICD-10-CM | POA: Diagnosis not present

## 2019-04-25 DIAGNOSIS — R159 Full incontinence of feces: Secondary | ICD-10-CM | POA: Diagnosis not present

## 2019-05-03 DIAGNOSIS — Z86718 Personal history of other venous thrombosis and embolism: Secondary | ICD-10-CM | POA: Diagnosis not present

## 2019-05-03 DIAGNOSIS — N529 Male erectile dysfunction, unspecified: Secondary | ICD-10-CM | POA: Diagnosis not present

## 2019-05-03 DIAGNOSIS — R5383 Other fatigue: Secondary | ICD-10-CM | POA: Diagnosis not present

## 2019-05-03 DIAGNOSIS — E785 Hyperlipidemia, unspecified: Secondary | ICD-10-CM | POA: Diagnosis not present

## 2019-05-03 DIAGNOSIS — I10 Essential (primary) hypertension: Secondary | ICD-10-CM | POA: Diagnosis not present

## 2019-05-03 DIAGNOSIS — Z7901 Long term (current) use of anticoagulants: Secondary | ICD-10-CM | POA: Diagnosis not present

## 2019-06-11 DIAGNOSIS — Z86718 Personal history of other venous thrombosis and embolism: Secondary | ICD-10-CM | POA: Diagnosis not present

## 2019-06-11 DIAGNOSIS — E785 Hyperlipidemia, unspecified: Secondary | ICD-10-CM | POA: Diagnosis not present

## 2019-06-11 DIAGNOSIS — Z7901 Long term (current) use of anticoagulants: Secondary | ICD-10-CM | POA: Diagnosis not present

## 2019-06-11 DIAGNOSIS — Z5181 Encounter for therapeutic drug level monitoring: Secondary | ICD-10-CM | POA: Diagnosis not present

## 2019-06-11 DIAGNOSIS — R5383 Other fatigue: Secondary | ICD-10-CM | POA: Diagnosis not present

## 2019-07-26 DIAGNOSIS — Z5181 Encounter for therapeutic drug level monitoring: Secondary | ICD-10-CM | POA: Diagnosis not present

## 2019-07-26 DIAGNOSIS — Z7901 Long term (current) use of anticoagulants: Secondary | ICD-10-CM | POA: Diagnosis not present

## 2019-07-26 DIAGNOSIS — Z86718 Personal history of other venous thrombosis and embolism: Secondary | ICD-10-CM | POA: Diagnosis not present

## 2019-09-06 DIAGNOSIS — Z86711 Personal history of pulmonary embolism: Secondary | ICD-10-CM | POA: Diagnosis not present

## 2019-09-06 DIAGNOSIS — Z7901 Long term (current) use of anticoagulants: Secondary | ICD-10-CM | POA: Diagnosis not present

## 2019-09-06 DIAGNOSIS — Z5181 Encounter for therapeutic drug level monitoring: Secondary | ICD-10-CM | POA: Diagnosis not present

## 2019-09-06 DIAGNOSIS — Z86718 Personal history of other venous thrombosis and embolism: Secondary | ICD-10-CM | POA: Diagnosis not present

## 2019-09-27 ENCOUNTER — Telehealth: Payer: Self-pay | Admitting: Adult Health

## 2019-09-27 NOTE — Telephone Encounter (Signed)
Please ask pt if this issue is [facility] related since [facility] is [facility].  If not, please schedule pt for in office visit.  Thanks!

## 2019-09-27 NOTE — Telephone Encounter (Signed)
Please advise what type of Appt needed for Pt ( states has been having Back Pain for at least  2 wks --(unsure if Virtual or In Office for exam ???  --forwarding to med asst.  -glh

## 2019-10-05 ENCOUNTER — Telehealth: Payer: Self-pay | Admitting: Adult Health

## 2019-10-05 DIAGNOSIS — Z20822 Contact with and (suspected) exposure to covid-19: Secondary | ICD-10-CM | POA: Diagnosis not present

## 2019-10-05 NOTE — Telephone Encounter (Signed)
Patient called states poss COVID exposure & seeking advice.  -Forwarding message to med asst & contact him @ [phone].  --Fausto Skillern

## 2019-10-05 NOTE — Telephone Encounter (Signed)
Pt states that a coworker just tested positive for COVID and that he was exposed to this coworker for more than 10 minutes.  However, he does not remember if they both were wearing masks at the time.  Pt states that he has bodyaches, headaches and "some" loss of smell.  Pt denies fever, cough, or SOB.  Advised pt to call to schedule testing appointment and quarantine within his home until he gets his results.  Pt provided with phone number to call for scheduling.  Pt expressed understanding and is agreeable.  Tiajuana Amass, CMA

## 2019-10-16 DIAGNOSIS — Z5181 Encounter for therapeutic drug level monitoring: Secondary | ICD-10-CM | POA: Diagnosis not present

## 2019-10-16 DIAGNOSIS — Z86718 Personal history of other venous thrombosis and embolism: Secondary | ICD-10-CM | POA: Diagnosis not present

## 2019-10-16 DIAGNOSIS — Z7901 Long term (current) use of anticoagulants: Secondary | ICD-10-CM | POA: Diagnosis not present

## 2019-10-23 ENCOUNTER — Telehealth: Payer: Self-pay | Admitting: Adult Health
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
## 2019-10-23 NOTE — Telephone Encounter (Signed)
Please call pt back and schedule him for OV with [facility].  Tiajuana Amass, CMA

## 2019-11-14 DIAGNOSIS — Z131 Encounter for screening for diabetes mellitus: Secondary | ICD-10-CM | POA: Diagnosis not present

## 2019-11-14 DIAGNOSIS — L299 Pruritus, unspecified: Secondary | ICD-10-CM | POA: Diagnosis not present

## 2019-11-14 DIAGNOSIS — Z125 Encounter for screening for malignant neoplasm of prostate: Secondary | ICD-10-CM | POA: Diagnosis not present

## 2019-11-14 DIAGNOSIS — D682 Hereditary deficiency of other clotting factors: Secondary | ICD-10-CM | POA: Diagnosis not present

## 2019-11-14 DIAGNOSIS — E785 Hyperlipidemia, unspecified: Secondary | ICD-10-CM | POA: Diagnosis not present

## 2019-11-14 DIAGNOSIS — Z23 Encounter for immunization: Secondary | ICD-10-CM | POA: Diagnosis not present

## 2019-11-14 DIAGNOSIS — I10 Essential (primary) hypertension: Secondary | ICD-10-CM | POA: Diagnosis not present

## 2019-11-22 DIAGNOSIS — Z7901 Long term (current) use of anticoagulants: Secondary | ICD-10-CM | POA: Diagnosis not present

## 2019-11-22 DIAGNOSIS — Z5181 Encounter for therapeutic drug level monitoring: Secondary | ICD-10-CM | POA: Diagnosis not present

## 2019-11-22 DIAGNOSIS — Z86718 Personal history of other venous thrombosis and embolism: Secondary | ICD-10-CM | POA: Diagnosis not present

## 2019-11-22 DIAGNOSIS — D682 Hereditary deficiency of other clotting factors: Secondary | ICD-10-CM | POA: Diagnosis not present

## 2019-11-26 DIAGNOSIS — Z20822 Contact with and (suspected) exposure to covid-19: Secondary | ICD-10-CM | POA: Diagnosis not present

## 2019-12-13 DIAGNOSIS — L508 Other urticaria: Secondary | ICD-10-CM | POA: Diagnosis not present

## 2019-12-13 DIAGNOSIS — E739 Lactose intolerance, unspecified: Secondary | ICD-10-CM | POA: Diagnosis not present

## 2019-12-13 DIAGNOSIS — H1089 Other conjunctivitis: Secondary | ICD-10-CM | POA: Diagnosis not present

## 2019-12-13 DIAGNOSIS — L509 Urticaria, unspecified: Secondary | ICD-10-CM | POA: Diagnosis not present

## 2020-01-02 DIAGNOSIS — Z86711 Personal history of pulmonary embolism: Secondary | ICD-10-CM | POA: Diagnosis not present

## 2020-01-02 DIAGNOSIS — L508 Other urticaria: Secondary | ICD-10-CM | POA: Diagnosis not present

## 2020-01-02 DIAGNOSIS — Z86718 Personal history of other venous thrombosis and embolism: Secondary | ICD-10-CM | POA: Diagnosis not present

## 2020-01-02 DIAGNOSIS — Z7901 Long term (current) use of anticoagulants: Secondary | ICD-10-CM | POA: Diagnosis not present

## 2020-01-02 DIAGNOSIS — Z5181 Encounter for therapeutic drug level monitoring: Secondary | ICD-10-CM | POA: Diagnosis not present

## 2020-01-25 DIAGNOSIS — E739 Lactose intolerance, unspecified: Secondary | ICD-10-CM | POA: Diagnosis not present

## 2020-01-25 DIAGNOSIS — L508 Other urticaria: Secondary | ICD-10-CM | POA: Diagnosis not present

## 2020-01-25 DIAGNOSIS — H1010 Acute atopic conjunctivitis, unspecified eye: Secondary | ICD-10-CM | POA: Diagnosis not present

## 2020-01-25 DIAGNOSIS — J309 Allergic rhinitis, unspecified: Secondary | ICD-10-CM | POA: Diagnosis not present

## 2020-02-05 ENCOUNTER — Encounter (HOSPITAL_BASED_OUTPATIENT_CLINIC_OR_DEPARTMENT_OTHER): Payer: Self-pay | Admitting: *Deleted

## 2020-02-05 ENCOUNTER — Emergency Department (HOSPITAL_BASED_OUTPATIENT_CLINIC_OR_DEPARTMENT_OTHER): Payer: No Typology Code available for payment source

## 2020-02-05 ENCOUNTER — Emergency Department (HOSPITAL_BASED_OUTPATIENT_CLINIC_OR_DEPARTMENT_OTHER)
Admission: EM | Admit: 2020-02-05 | Discharge: 2020-02-05 | Disposition: A | Discharge status: Home / Self Care | Payer: No Typology Code available for payment source | Attending: Emergency Medicine | Admitting: Emergency Medicine

## 2020-02-05 ENCOUNTER — Other Ambulatory Visit: Payer: Self-pay

## 2020-02-05 DIAGNOSIS — M25562 Pain in left knee: Secondary | ICD-10-CM | POA: Diagnosis present

## 2020-02-05 DIAGNOSIS — Z87891 Personal history of nicotine dependence: Secondary | ICD-10-CM | POA: Insufficient documentation

## 2020-02-05 DIAGNOSIS — I825Z2 Chronic embolism and thrombosis of unspecified deep veins of left distal lower extremity: Secondary | ICD-10-CM

## 2020-02-05 DIAGNOSIS — I82502 Chronic embolism and thrombosis of unspecified deep veins of left lower extremity: Secondary | ICD-10-CM | POA: Diagnosis not present

## 2020-02-05 DIAGNOSIS — I82592 Chronic embolism and thrombosis of other specified deep vein of left lower extremity: Secondary | ICD-10-CM | POA: Diagnosis not present

## 2020-02-05 DIAGNOSIS — Z79899 Other long term (current) drug therapy: Secondary | ICD-10-CM | POA: Diagnosis not present

## 2020-02-05 DIAGNOSIS — Z7901 Long term (current) use of anticoagulants: Secondary | ICD-10-CM | POA: Diagnosis not present

## 2020-02-05 DIAGNOSIS — I10 Essential (primary) hypertension: Secondary | ICD-10-CM | POA: Diagnosis not present

## 2020-02-05 LAB — CBC WITH DIFFERENTIAL/PLATELET
Abs Immature Granulocytes: 0.02 10*3/uL (ref 0.00–0.07)
Basophils Absolute: 0 10*3/uL (ref 0.0–0.1)
Basophils Relative: 1 %
Eosinophils Absolute: 0.2 10*3/uL (ref 0.0–0.5)
Eosinophils Relative: 2 %
HCT: 45.7 % (ref 39.0–52.0)
Hemoglobin: 15.4 g/dL (ref 13.0–17.0)
Immature Granulocytes: 0 %
Lymphocytes Relative: 40 %
Lymphs Abs: 3 10*3/uL (ref 0.7–4.0)
MCH: 31 pg (ref 26.0–34.0)
MCHC: 33.7 g/dL (ref 30.0–36.0)
MCV: 92 fL (ref 80.0–100.0)
Monocytes Absolute: 0.5 10*3/uL (ref 0.1–1.0)
Monocytes Relative: 7 %
Neutro Abs: 3.6 10*3/uL (ref 1.7–7.7)
Neutrophils Relative %: 50 %
Platelets: 215 10*3/uL (ref 150–400)
RBC: 4.97 MIL/uL (ref 4.22–5.81)
RDW: 13.2 % (ref 11.5–15.5)
WBC: 7.3 10*3/uL (ref 4.0–10.5)
nRBC: 0 % (ref 0.0–0.2)

## 2020-02-05 LAB — BASIC METABOLIC PANEL
Anion gap: 11 (ref 5–15)
BUN: 16 mg/dL (ref 6–20)
CO2: 25 mmol/L (ref 22–32)
Calcium: 8.6 mg/dL — ABNORMAL LOW (ref 8.9–10.3)
Chloride: 101 mmol/L (ref 98–111)
Creatinine, Ser: 0.88 mg/dL (ref 0.61–1.24)
GFR calc Af Amer: >60 mL/min (ref >60)
GFR calc non Af Amer: >60 mL/min (ref >60)
Glucose, Bld: 92 mg/dL (ref 70–99)
Potassium: 3.7 mmol/L (ref 3.5–5.1)
Sodium: 137 mmol/L (ref 135–145)

## 2020-02-05 LAB — PROTIME-INR
INR: 1.9 — ABNORMAL HIGH (ref 0.8–1.2)
Prothrombin Time: 21.3 seconds — ABNORMAL HIGH (ref 11.4–15.2)

## 2020-02-05 NOTE — Discharge Instructions (Addendum)
The radiologist noted a nonocclusive, likely chronic, DVT at the level of the tibioperoneal trunk.  Please discuss this finding with you are primary care doctor.  Your INR level was 1.9 (slightly low).  Please continue your Coumadin levels that are currently prescribed.  However, please discuss your Coumadin dosing as well as this finding with your primary doctor.  You may need to increase your coumadin doses. Anticipate you will need a repeat ultrasound to monitor this finding. I will defer these decisions to your primary doctor.  If you develop worsening swelling, leg pain, any chest pain or difficulty in breathing, please return to ER for reassessment.

## 2020-02-05 NOTE — ED Provider Notes (Signed)
[place] EMERGENCY DEPARTMENT Provider Note   CSN: [id number] Arrival date & time: 02/05/20  1510     History Chief Complaint  Patient presents with  . Knee Pain    Caleb Kirby is a 54 y.o. male.  Past medical history DVT, PE, on chronic anticoagulation with warfarin.  Patient presents to ER with concern for facial swelling behind left knee.  States over the past couple months he occasionally has sensation of fullness, swelling on his left knee.  Worse after long day at work.  Does not have significant pain with this, does not interfere with his walking.  States he currently does not have pain or swelling in his knee.  Reports compliance with his Coumadin, has INR checks every 6 weeks with primary doctor at [facility].  No CP/SOB.   HPI     Past Medical History:  Diagnosis Date  . DVT, lower extremity ([place])   . Epilepsy ([place])   . Factor V Leiden ([place])   . Hypertension   . Pulmonary embolism [facility])     Patient Active Problem List   Diagnosis Date Noted  . Right inguinal pain 02/14/2019  . Hematuria 02/14/2019  . Hearing loss of right ear due to cerumen impaction 12/04/2018  . HTN, goal below 130/80 10/30/2018  . Hypercholesteremia 10/30/2018  . Nonintractable epilepsy without status epilepticus ([place]) 10/30/2018  . Healthcare maintenance 10/30/2018  . Depression, recurrent ([place]) 10/30/2018  . BMI 39.0-39.9,adult 10/30/2018    Past Surgical History:  Procedure Laterality Date  . TYMPANOPLASTY         Family History  Problem Relation Age of Onset  . Heart attack Mother   . Alcohol abuse Father   . Cancer Sister        GI  . Cancer Brother        colon  . Cancer Maternal Grandmother        metastatic    Social History   Tobacco Use  . Smoking status: Former Smoker    Packs/day: 1.00    Years: 20.00    Pack years: 20.00    Types: Cigarettes    Quit date: 07/21/1997    Years since quitting: 22.5  . Smokeless tobacco: Never Used  Substance Use  Topics  . Alcohol use: Yes    Alcohol/week: 1.0 standard drinks    Types: 1 Cans of beer per week  . Drug use: Not Currently    Home Medications Prior to Admission medications   Medication Sig Start Date End Date Taking? Authorizing Provider  atorvastatin (LIPITOR) 10 MG tablet Take 1 tablet by mouth daily.   Yes [provider]  hydrochlorothiazide (HYDRODIURIL) 25 MG tablet Take 1 tablet by mouth daily. 09/09/18  Yes [provider]  losartan (COZAAR) 50 MG tablet Take 25 mg by mouth daily.   Yes [provider]  venlafaxine XR (EFFEXOR-XR) 150 MG 24 hr capsule Take 150 mg by mouth daily with breakfast.   Yes [provider]  warfarin (COUMADIN) 5 MG tablet Take 7.5 mg by mouth daily. 10/11/18  Yes [provider]  phenytoin (DILANTIN) 200 MG ER capsule Take 200 mg by mouth 2 (two) times daily.    [provider]    Allergies    Patient has no known allergies.  Review of Systems   Review of Systems  Constitutional: Negative for chills and fever.  HENT: Negative for ear pain and sore throat.   Eyes: Negative for pain and visual  disturbance.  Respiratory: Negative for cough and shortness of breath.   Cardiovascular: Negative for chest pain and palpitations.  Gastrointestinal: Negative for abdominal pain and vomiting.  Genitourinary: Negative for dysuria and hematuria.  Musculoskeletal: Positive for arthralgias. Negative for back pain.  Skin: Negative for color change and rash.  Neurological: Negative for seizures and syncope.  All other systems reviewed and are negative.   Physical Exam Updated Vital Signs BP (!) 138/108 (BP Location: Left Arm)   Pulse 67   Temp 98.2 F (36.8 C) (Oral)   Resp 18   Ht 6' (1.829 m)   Wt 124.7 kg   SpO2 100%   BMI 37.30 kg/m   Physical Exam Vitals and nursing note reviewed.  Constitutional:      Appearance: He is well-developed.  HENT:     Head: Normocephalic and atraumatic.   Eyes:     Conjunctiva/sclera: Conjunctivae normal.  Cardiovascular:     Rate and Rhythm: Normal rate and regular rhythm.     Heart sounds: No murmur.  Pulmonary:     Effort: Pulmonary effort is normal. No respiratory distress.     Breath sounds: Normal breath sounds.  Abdominal:     Palpations: Abdomen is soft.     Tenderness: There is no abdominal tenderness.  Musculoskeletal:     Cervical back: Neck supple.     Comments: LLE: no TTP throughout, no edema, no deformity, normal DP/PT pulses, normal ROM  Skin:    General: Skin is warm and dry.  Neurological:     Mental Status: He is alert.     ED Results / Procedures / Treatments   Labs (all labs ordered are listed, but only abnormal results are displayed) Labs Reviewed  PROTIME-INR - Abnormal; Notable for the following components:      Result Value   Prothrombin Time 21.3 (*)    INR 1.9 (*)    All other components within normal limits  BASIC METABOLIC PANEL - Abnormal; Notable for the following components:   Calcium 8.6 (*)    All other components within normal limits  CBC WITH DIFFERENTIAL/PLATELET    EKG None  Radiology US Venous Img Lower  Left (DVT Study)  Result Date: 02/05/2020 CLINICAL DATA:  Left leg swelling. EXAM: LEFT LOWER EXTREMITY VENOUS DOPPLER ULTRASOUND TECHNIQUE: Gray-scale sonography with compression, as well as color and duplex ultrasound, were performed to evaluate the deep venous system(s) from the level of the common femoral vein through the popliteal and proximal calf veins. COMPARISON:  None. FINDINGS: VENOUS Normal compressibility of the common femoral, superficial femoral, and popliteal veins, as well as the visualized calf veins. However, there is nonocclusive, eccentric thrombus within the tibioperoneal trunk. Visualized portions of profunda femoral vein and great saphenous vein unremarkable. No filling defects to suggest DVT on grayscale or color Doppler imaging. Doppler waveforms show normal  direction of venous flow, normal respiratory plasticity and response to augmentation. Limited views of the contralateral common femoral vein are unremarkable. OTHER None. Limitations: none IMPRESSION: There is a nonocclusive, likely chronic, DVT at the level of the tibioperoneal trunk on the left. Electronically Signed   By: Katherine Mantle M.D.   On: 02/05/2020 18:49    Procedures Procedures (including critical care time)  Medications Ordered in ED Medications - No data to display  ED Course  I have reviewed the triage vital signs and the nursing notes.  Pertinent labs & imaging results that were available during my care of the patient were reviewed  by me and considered in my medical decision making (see chart for details).    MDM Rules/Calculators/A&P                      54 year old male notable history of DVT/PE/factor V Leiden on chronic Coumadin therapy presenting to ER with concern for swelling behind his left knee.  Today patient x-ray does not have any swelling behind his knee, no deformity on exam, neurovascularly intact.  Given his VTE history, check lower extremity DVT study, INR.  Ultrasound demonstrated a likely chronic, nonocclusive DVT in the tibiofemoral trunk.  INR 1.9.  As patient has unremarkable physical exam of his left leg today, unclear if this is the cause of his reported symptoms.  Certainly at this time, does not appear to be causing any issues with pain, vascular compromise.  Furthermore, patient has not had any chest pain or difficulty breathing, tachypnea or, hypoxia or tachycardia to suggest that he has recurring PEs. Do not feel patient would benefit from thrombectomy, do not see indication for filter.  Given the current clinical status, chronicity of this finding, will refer patient back to PCP to discuss any changes in management. Rec discussing current INR and long term INR goal with PCP. Additionally recommend discussing repeat US to monitor this finding and  ensure no progression of DVT burden. Notified that for a goal of 2-3, he is slightly low. Reviewed return precautions in detail with patient and discharged home.     After the discussed management above, the patient was determined to be safe for discharge.  The patient was in agreement with this plan and all questions regarding their care were answered.  ED return precautions were discussed and the patient will return to the ED with any significant worsening of condition.   Final Clinical Impression(s) / ED Diagnoses Final diagnoses:  Chronic deep vein thrombosis (DVT) of distal vein of left lower extremity [facility])    Rx / DC Orders ED Discharge Orders    None       Milagros Loll, MD 02/06/20 1103

## 2020-02-05 NOTE — ED Triage Notes (Signed)
Pain behind his left knee x 2 days. States he has a hx of blood clots in the same spot. He is presently taking blood thinner.

## 2020-02-07 DIAGNOSIS — I82812 Embolism and thrombosis of superficial veins of left lower extremities: Secondary | ICD-10-CM | POA: Diagnosis not present

## 2020-02-07 DIAGNOSIS — I82890 Acute embolism and thrombosis of other specified veins: Secondary | ICD-10-CM | POA: Diagnosis not present

## 2020-02-14 DIAGNOSIS — Z5181 Encounter for therapeutic drug level monitoring: Secondary | ICD-10-CM | POA: Diagnosis not present

## 2020-02-14 DIAGNOSIS — Z7901 Long term (current) use of anticoagulants: Secondary | ICD-10-CM | POA: Diagnosis not present

## 2020-02-14 DIAGNOSIS — Z86711 Personal history of pulmonary embolism: Secondary | ICD-10-CM | POA: Diagnosis not present

## 2020-02-14 DIAGNOSIS — Z86718 Personal history of other venous thrombosis and embolism: Secondary | ICD-10-CM | POA: Diagnosis not present

## 2020-02-28 DIAGNOSIS — Z86718 Personal history of other venous thrombosis and embolism: Secondary | ICD-10-CM | POA: Diagnosis not present

## 2020-02-28 DIAGNOSIS — Z5181 Encounter for therapeutic drug level monitoring: Secondary | ICD-10-CM | POA: Diagnosis not present

## 2020-02-28 DIAGNOSIS — Z86711 Personal history of pulmonary embolism: Secondary | ICD-10-CM | POA: Diagnosis not present

## 2020-02-28 DIAGNOSIS — Z7901 Long term (current) use of anticoagulants: Secondary | ICD-10-CM | POA: Diagnosis not present

## 2020-04-02 DIAGNOSIS — Z86718 Personal history of other venous thrombosis and embolism: Secondary | ICD-10-CM | POA: Diagnosis not present

## 2020-04-02 DIAGNOSIS — Z5181 Encounter for therapeutic drug level monitoring: Secondary | ICD-10-CM | POA: Diagnosis not present

## 2020-04-02 DIAGNOSIS — Z7901 Long term (current) use of anticoagulants: Secondary | ICD-10-CM | POA: Diagnosis not present

## 2020-04-02 DIAGNOSIS — Z86711 Personal history of pulmonary embolism: Secondary | ICD-10-CM | POA: Diagnosis not present

## 2020-04-16 DIAGNOSIS — M25551 Pain in right hip: Secondary | ICD-10-CM | POA: Diagnosis not present

## 2020-04-16 DIAGNOSIS — M5137 Other intervertebral disc degeneration, lumbosacral region: Secondary | ICD-10-CM | POA: Diagnosis not present

## 2020-05-07 DIAGNOSIS — H2513 Age-related nuclear cataract, bilateral: Secondary | ICD-10-CM | POA: Diagnosis not present

## 2020-05-07 DIAGNOSIS — H524 Presbyopia: Secondary | ICD-10-CM | POA: Diagnosis not present

## 2020-05-07 DIAGNOSIS — H1013 Acute atopic conjunctivitis, bilateral: Secondary | ICD-10-CM | POA: Diagnosis not present

## 2020-05-21 DIAGNOSIS — M47816 Spondylosis without myelopathy or radiculopathy, lumbar region: Secondary | ICD-10-CM | POA: Diagnosis not present

## 2020-05-21 DIAGNOSIS — M160 Bilateral primary osteoarthritis of hip: Secondary | ICD-10-CM | POA: Diagnosis not present

## 2020-05-21 DIAGNOSIS — M4316 Spondylolisthesis, lumbar region: Secondary | ICD-10-CM | POA: Diagnosis not present

## 2020-05-28 DIAGNOSIS — Z5181 Encounter for therapeutic drug level monitoring: Secondary | ICD-10-CM | POA: Diagnosis not present

## 2020-05-28 DIAGNOSIS — Z7901 Long term (current) use of anticoagulants: Secondary | ICD-10-CM | POA: Diagnosis not present

## 2020-05-28 DIAGNOSIS — D6851 Activated protein C resistance: Secondary | ICD-10-CM | POA: Diagnosis not present

## 2020-05-28 DIAGNOSIS — Z86711 Personal history of pulmonary embolism: Secondary | ICD-10-CM | POA: Diagnosis not present

## 2020-06-02 DIAGNOSIS — Z86718 Personal history of other venous thrombosis and embolism: Secondary | ICD-10-CM | POA: Diagnosis not present

## 2020-06-02 DIAGNOSIS — Z86711 Personal history of pulmonary embolism: Secondary | ICD-10-CM | POA: Diagnosis not present

## 2020-06-02 DIAGNOSIS — D6851 Activated protein C resistance: Secondary | ICD-10-CM | POA: Diagnosis not present

## 2020-06-02 DIAGNOSIS — H9313 Tinnitus, bilateral: Secondary | ICD-10-CM | POA: Diagnosis not present

## 2020-06-02 DIAGNOSIS — H90A22 Sensorineural hearing loss, unilateral, left ear, with restricted hearing on the contralateral side: Secondary | ICD-10-CM | POA: Diagnosis not present

## 2020-06-02 DIAGNOSIS — Z7901 Long term (current) use of anticoagulants: Secondary | ICD-10-CM | POA: Diagnosis not present

## 2020-06-02 DIAGNOSIS — H90A31 Mixed conductive and sensorineural hearing loss, unilateral, right ear with restricted hearing on the contralateral side: Secondary | ICD-10-CM | POA: Diagnosis not present

## 2020-06-06 DIAGNOSIS — I82402 Acute embolism and thrombosis of unspecified deep veins of left lower extremity: Secondary | ICD-10-CM | POA: Diagnosis not present

## 2020-06-06 DIAGNOSIS — I2699 Other pulmonary embolism without acute cor pulmonale: Secondary | ICD-10-CM | POA: Diagnosis not present

## 2020-06-20 DIAGNOSIS — H9313 Tinnitus, bilateral: Secondary | ICD-10-CM | POA: Diagnosis not present

## 2020-06-20 DIAGNOSIS — H903 Sensorineural hearing loss, bilateral: Secondary | ICD-10-CM | POA: Diagnosis not present

## 2020-06-23 DIAGNOSIS — Z7901 Long term (current) use of anticoagulants: Secondary | ICD-10-CM | POA: Diagnosis not present

## 2020-06-23 DIAGNOSIS — D6859 Other primary thrombophilia: Secondary | ICD-10-CM | POA: Diagnosis not present

## 2020-06-23 DIAGNOSIS — Z5181 Encounter for therapeutic drug level monitoring: Secondary | ICD-10-CM | POA: Diagnosis not present

## 2020-06-23 DIAGNOSIS — I82599 Chronic embolism and thrombosis of other specified deep vein of unspecified lower extremity: Secondary | ICD-10-CM | POA: Diagnosis not present

## 2020-07-09 DIAGNOSIS — I82812 Embolism and thrombosis of superficial veins of left lower extremities: Secondary | ICD-10-CM | POA: Diagnosis not present

## 2020-07-09 DIAGNOSIS — I82442 Acute embolism and thrombosis of left tibial vein: Secondary | ICD-10-CM | POA: Diagnosis not present

## 2020-07-17 DIAGNOSIS — M25462 Effusion, left knee: Secondary | ICD-10-CM | POA: Diagnosis not present

## 2020-07-17 DIAGNOSIS — M1712 Unilateral primary osteoarthritis, left knee: Secondary | ICD-10-CM | POA: Diagnosis not present

## 2020-07-23 DIAGNOSIS — M25562 Pain in left knee: Secondary | ICD-10-CM | POA: Diagnosis not present

## 2020-07-25 DIAGNOSIS — Z7901 Long term (current) use of anticoagulants: Secondary | ICD-10-CM | POA: Diagnosis not present

## 2020-07-25 DIAGNOSIS — Z86718 Personal history of other venous thrombosis and embolism: Secondary | ICD-10-CM | POA: Diagnosis not present

## 2020-08-15 DIAGNOSIS — Z7901 Long term (current) use of anticoagulants: Secondary | ICD-10-CM | POA: Diagnosis not present

## 2020-08-15 DIAGNOSIS — Z86718 Personal history of other venous thrombosis and embolism: Secondary | ICD-10-CM | POA: Diagnosis not present

## 2020-09-09 DIAGNOSIS — Z461 Encounter for fitting and adjustment of hearing aid: Secondary | ICD-10-CM | POA: Diagnosis not present

## 2020-09-09 DIAGNOSIS — H903 Sensorineural hearing loss, bilateral: Secondary | ICD-10-CM | POA: Diagnosis not present

## 2020-11-19 DIAGNOSIS — Z0189 Encounter for other specified special examinations: Secondary | ICD-10-CM | POA: Diagnosis not present

## 2020-12-31 DIAGNOSIS — Z7901 Long term (current) use of anticoagulants: Secondary | ICD-10-CM | POA: Diagnosis not present

## 2021-02-09 DIAGNOSIS — Z7901 Long term (current) use of anticoagulants: Secondary | ICD-10-CM | POA: Diagnosis not present

## 2021-03-03 DIAGNOSIS — Z7901 Long term (current) use of anticoagulants: Secondary | ICD-10-CM | POA: Diagnosis not present

## 2021-03-03 DIAGNOSIS — Z86718 Personal history of other venous thrombosis and embolism: Secondary | ICD-10-CM | POA: Diagnosis not present

## 2021-03-03 DIAGNOSIS — Z5181 Encounter for therapeutic drug level monitoring: Secondary | ICD-10-CM | POA: Diagnosis not present

## 2021-04-10 DIAGNOSIS — Z86718 Personal history of other venous thrombosis and embolism: Secondary | ICD-10-CM | POA: Diagnosis not present

## 2021-04-10 DIAGNOSIS — Z0189 Encounter for other specified special examinations: Secondary | ICD-10-CM | POA: Diagnosis not present

## 2021-04-10 DIAGNOSIS — Z5181 Encounter for therapeutic drug level monitoring: Secondary | ICD-10-CM | POA: Diagnosis not present

## 2021-04-10 DIAGNOSIS — Z7901 Long term (current) use of anticoagulants: Secondary | ICD-10-CM | POA: Diagnosis not present

## 2021-04-10 DIAGNOSIS — H1013 Acute atopic conjunctivitis, bilateral: Secondary | ICD-10-CM | POA: Diagnosis not present

## 2021-04-10 DIAGNOSIS — H524 Presbyopia: Secondary | ICD-10-CM | POA: Diagnosis not present

## 2021-04-10 DIAGNOSIS — H2513 Age-related nuclear cataract, bilateral: Secondary | ICD-10-CM | POA: Diagnosis not present

## 2021-04-27 DIAGNOSIS — H524 Presbyopia: Secondary | ICD-10-CM | POA: Diagnosis not present

## 2021-04-29 DIAGNOSIS — Z5181 Encounter for therapeutic drug level monitoring: Secondary | ICD-10-CM | POA: Diagnosis not present

## 2021-04-29 DIAGNOSIS — Z86718 Personal history of other venous thrombosis and embolism: Secondary | ICD-10-CM | POA: Diagnosis not present

## 2021-05-28 DIAGNOSIS — Z7901 Long term (current) use of anticoagulants: Secondary | ICD-10-CM | POA: Diagnosis not present

## 2021-05-28 DIAGNOSIS — Z5181 Encounter for therapeutic drug level monitoring: Secondary | ICD-10-CM | POA: Diagnosis not present

## 2021-05-28 DIAGNOSIS — D6851 Activated protein C resistance: Secondary | ICD-10-CM | POA: Diagnosis not present

## 2021-05-28 DIAGNOSIS — Z86718 Personal history of other venous thrombosis and embolism: Secondary | ICD-10-CM | POA: Diagnosis not present

## 2021-06-01 DIAGNOSIS — D485 Neoplasm of uncertain behavior of skin: Secondary | ICD-10-CM | POA: Diagnosis not present

## 2021-06-01 DIAGNOSIS — L821 Other seborrheic keratosis: Secondary | ICD-10-CM | POA: Diagnosis not present

## 2021-06-09 DIAGNOSIS — D229 Melanocytic nevi, unspecified: Secondary | ICD-10-CM | POA: Diagnosis not present

## 2021-06-30 DIAGNOSIS — I10 Essential (primary) hypertension: Secondary | ICD-10-CM | POA: Diagnosis not present

## 2021-06-30 DIAGNOSIS — Z5181 Encounter for therapeutic drug level monitoring: Secondary | ICD-10-CM | POA: Diagnosis not present

## 2021-06-30 DIAGNOSIS — G40909 Epilepsy, unspecified, not intractable, without status epilepticus: Secondary | ICD-10-CM | POA: Diagnosis not present

## 2021-06-30 DIAGNOSIS — E785 Hyperlipidemia, unspecified: Secondary | ICD-10-CM | POA: Diagnosis not present

## 2021-06-30 DIAGNOSIS — Z7901 Long term (current) use of anticoagulants: Secondary | ICD-10-CM | POA: Diagnosis not present

## 2021-06-30 DIAGNOSIS — Z86711 Personal history of pulmonary embolism: Secondary | ICD-10-CM | POA: Diagnosis not present

## 2021-06-30 DIAGNOSIS — D6851 Activated protein C resistance: Secondary | ICD-10-CM | POA: Diagnosis not present

## 2021-06-30 DIAGNOSIS — Z86718 Personal history of other venous thrombosis and embolism: Secondary | ICD-10-CM | POA: Diagnosis not present

## 2021-07-03 DIAGNOSIS — K5900 Constipation, unspecified: Secondary | ICD-10-CM | POA: Diagnosis not present

## 2021-07-03 DIAGNOSIS — Z800 Family history of malignant neoplasm of digestive organs: Secondary | ICD-10-CM | POA: Diagnosis not present

## 2021-07-03 DIAGNOSIS — Z86010 Personal history of colonic polyps: Secondary | ICD-10-CM | POA: Diagnosis not present

## 2021-07-03 DIAGNOSIS — Z7901 Long term (current) use of anticoagulants: Secondary | ICD-10-CM | POA: Diagnosis not present

## 2021-07-30 DIAGNOSIS — Z5181 Encounter for therapeutic drug level monitoring: Secondary | ICD-10-CM | POA: Diagnosis not present

## 2021-07-30 DIAGNOSIS — Z7901 Long term (current) use of anticoagulants: Secondary | ICD-10-CM | POA: Diagnosis not present

## 2021-07-30 DIAGNOSIS — D6851 Activated protein C resistance: Secondary | ICD-10-CM | POA: Diagnosis not present

## 2021-07-30 DIAGNOSIS — Z86718 Personal history of other venous thrombosis and embolism: Secondary | ICD-10-CM | POA: Diagnosis not present

## 2021-08-11 DIAGNOSIS — Z7901 Long term (current) use of anticoagulants: Secondary | ICD-10-CM | POA: Diagnosis not present

## 2021-08-31 DIAGNOSIS — Z86718 Personal history of other venous thrombosis and embolism: Secondary | ICD-10-CM | POA: Diagnosis not present

## 2021-08-31 DIAGNOSIS — Z7901 Long term (current) use of anticoagulants: Secondary | ICD-10-CM | POA: Diagnosis not present

## 2021-08-31 DIAGNOSIS — Z5181 Encounter for therapeutic drug level monitoring: Secondary | ICD-10-CM | POA: Diagnosis not present

## 2021-11-25 IMAGING — US US EXTREM LOW VENOUS*L*
1 series · 14 of 24 positions shown · non-contrast
Comparison: None.

CLINICAL DATA: Left leg swelling.

EXAM:
LEFT LOWER EXTREMITY VENOUS DOPPLER ULTRASOUND
TECHNIQUE: Gray-scale sonography with compression, as well as color and duplex
ultrasound, were performed to evaluate the deep venous system(s)
from the level of the common femoral vein through the popliteal and
proximal calf veins.

[Series 1: us extrem low venous*left* · 14 of 40 slices shown]
[im 1/40]
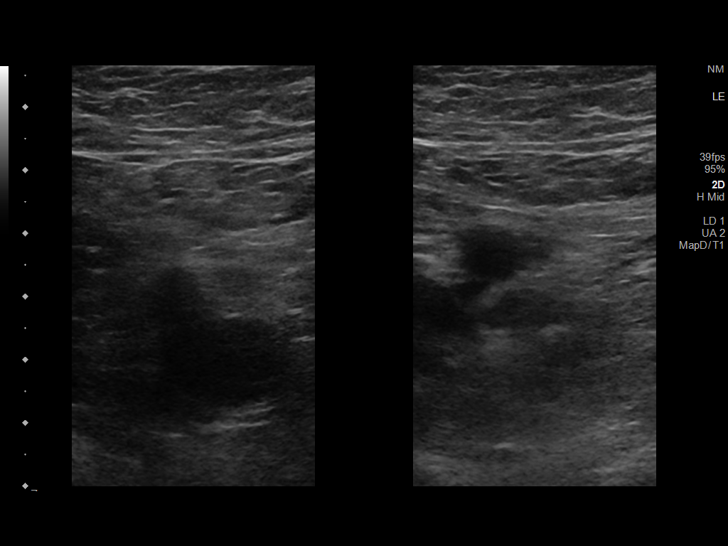
[im 4/40]
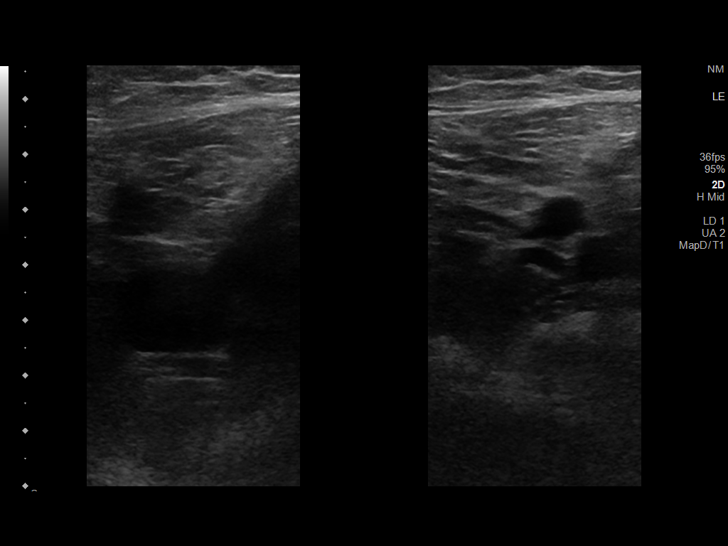
[im 7/40]
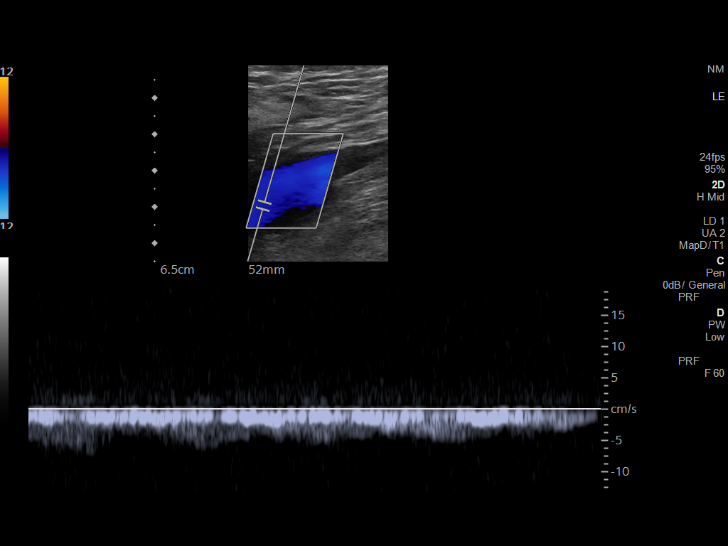
[im 11/40]
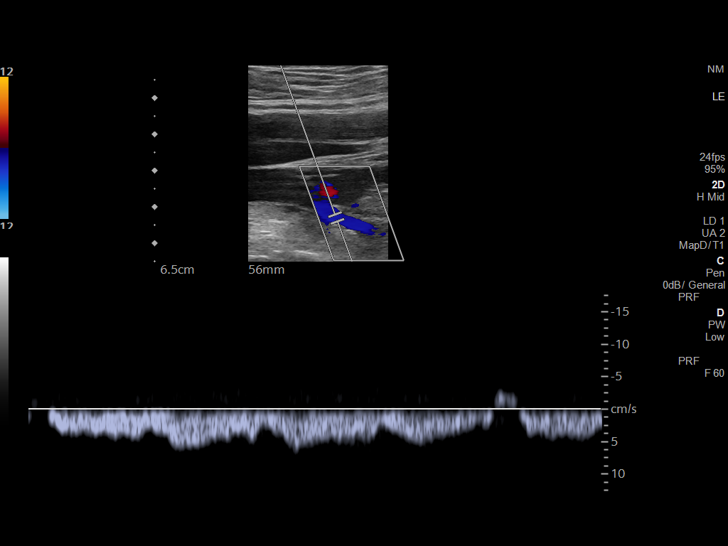
[im 12/40]
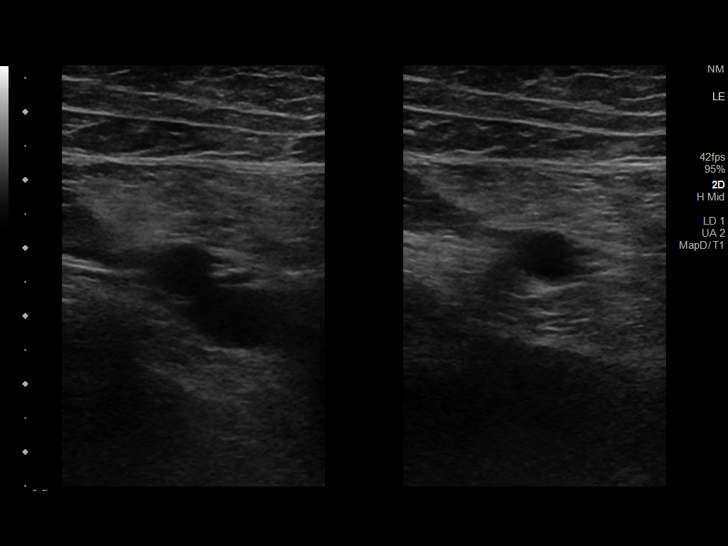
[im 16/40]
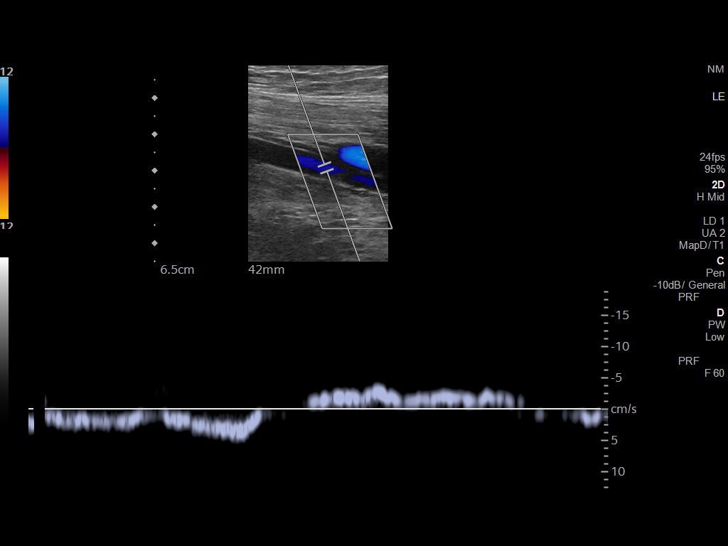
[im 19/40]
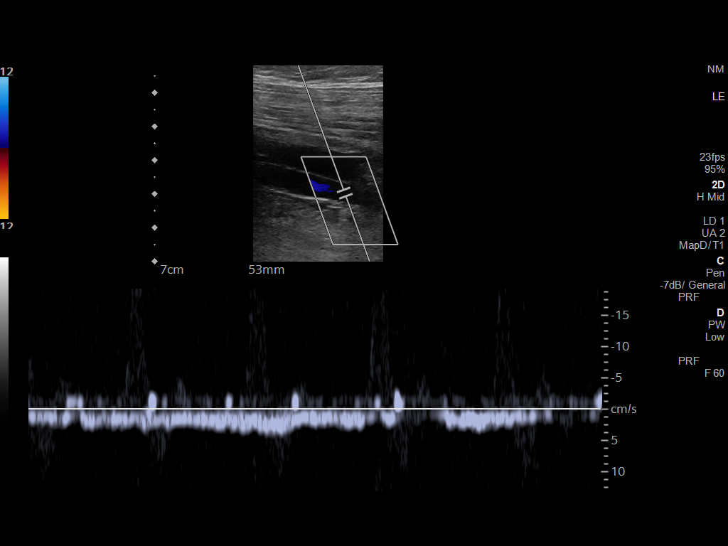
[im 21/40]
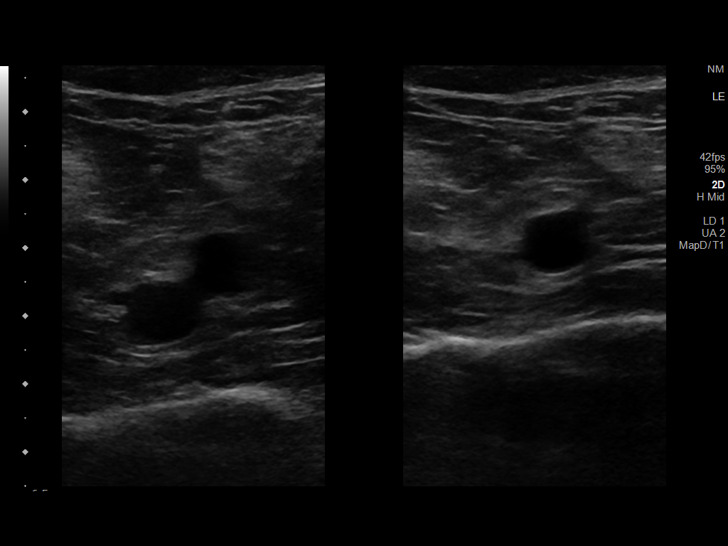
[im 24/40]
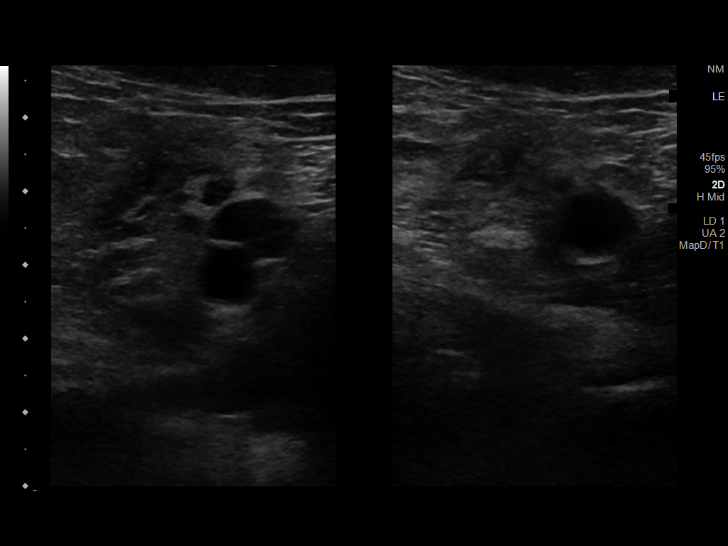
[im 28/40]
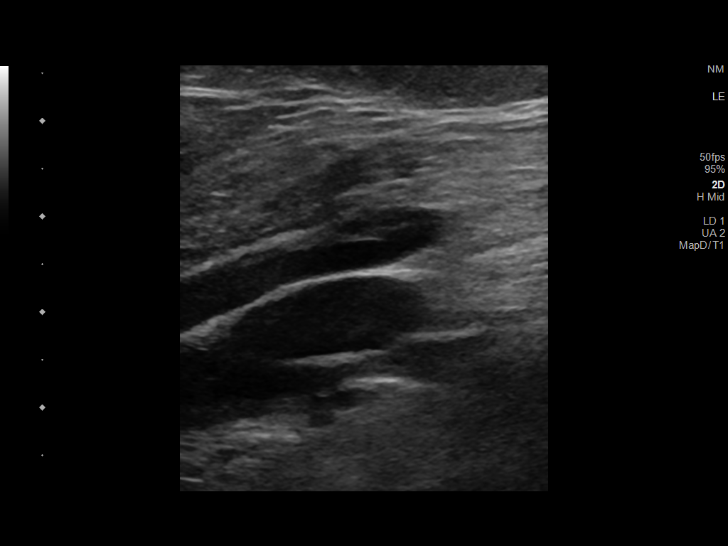
[im 31/40]
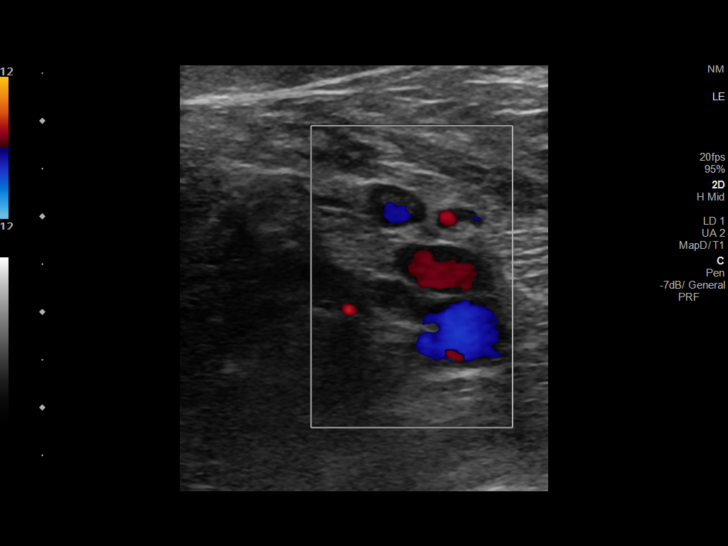
[im 33/40]
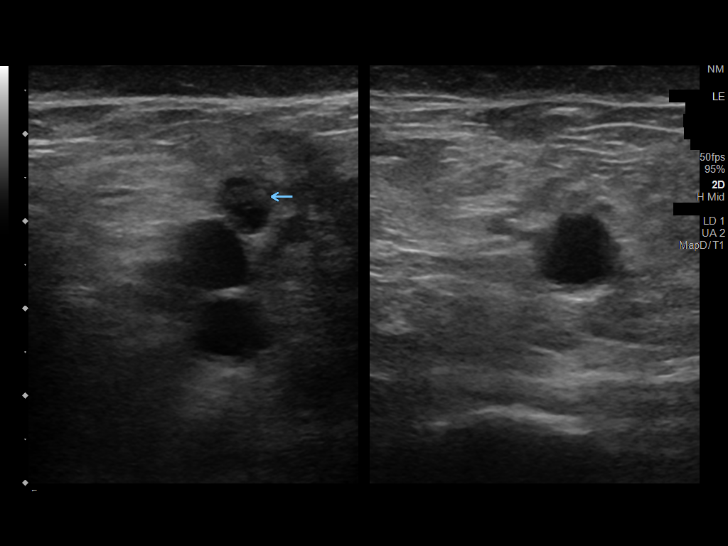
[im 36/40]
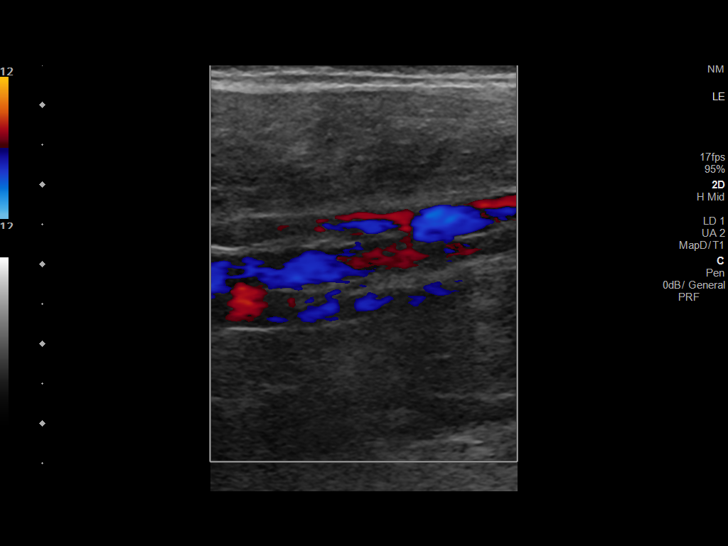
[im 40/40]
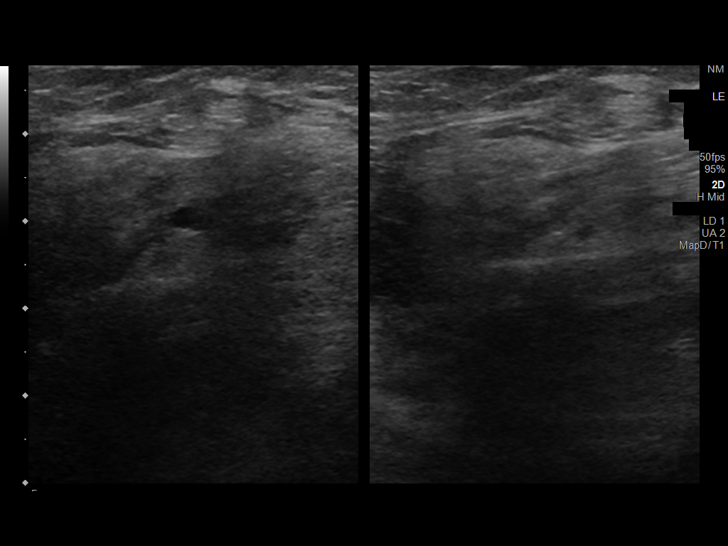

[14 of 24 positions shown; findings below may reference images not displayed]

FINDINGS: VENOUS

Normal compressibility of the common femoral, superficial femoral,
and popliteal veins, as well as the visualized calf veins. However,
there is nonocclusive, eccentric thrombus within the tibioperoneal
trunk. Visualized portions of profunda femoral vein and great
saphenous vein unremarkable. No filling defects to suggest DVT on
grayscale or color Doppler imaging. Doppler waveforms show normal
direction of venous flow, normal respiratory plasticity and response
to augmentation.

Limited views of the contralateral common femoral vein are
unremarkable.

OTHER

None.

Limitations: none
IMPRESSION: There is a nonocclusive, likely chronic, DVT at the level of the
tibioperoneal trunk on the left.

## 2022-08-03 DIAGNOSIS — Z86718 Personal history of other venous thrombosis and embolism: Secondary | ICD-10-CM | POA: Diagnosis not present

## 2022-08-03 DIAGNOSIS — Z5181 Encounter for therapeutic drug level monitoring: Secondary | ICD-10-CM | POA: Diagnosis not present

## 2022-08-03 DIAGNOSIS — Z7901 Long term (current) use of anticoagulants: Secondary | ICD-10-CM | POA: Diagnosis not present

## 2022-09-14 DIAGNOSIS — Z5181 Encounter for therapeutic drug level monitoring: Secondary | ICD-10-CM | POA: Diagnosis not present

## 2022-09-14 DIAGNOSIS — Z7901 Long term (current) use of anticoagulants: Secondary | ICD-10-CM | POA: Diagnosis not present

## 2022-09-14 DIAGNOSIS — Z86718 Personal history of other venous thrombosis and embolism: Secondary | ICD-10-CM | POA: Diagnosis not present

## 2022-09-22 DIAGNOSIS — Z0189 Encounter for other specified special examinations: Secondary | ICD-10-CM | POA: Diagnosis not present

## 2022-10-13 DIAGNOSIS — Z0189 Encounter for other specified special examinations: Secondary | ICD-10-CM | POA: Diagnosis not present

## 2022-10-25 DIAGNOSIS — M170 Bilateral primary osteoarthritis of knee: Secondary | ICD-10-CM | POA: Diagnosis not present

## 2022-11-17 DIAGNOSIS — Z0189 Encounter for other specified special examinations: Secondary | ICD-10-CM | POA: Diagnosis not present

## 2022-11-18 DIAGNOSIS — I10 Essential (primary) hypertension: Secondary | ICD-10-CM | POA: Diagnosis not present

## 2022-11-18 DIAGNOSIS — G4733 Obstructive sleep apnea (adult) (pediatric): Secondary | ICD-10-CM | POA: Diagnosis not present

## 2022-11-18 DIAGNOSIS — I82503 Chronic embolism and thrombosis of unspecified deep veins of lower extremity, bilateral: Secondary | ICD-10-CM | POA: Diagnosis not present

## 2022-11-18 DIAGNOSIS — R0602 Shortness of breath: Secondary | ICD-10-CM | POA: Diagnosis not present

## 2022-11-18 DIAGNOSIS — E785 Hyperlipidemia, unspecified: Secondary | ICD-10-CM | POA: Diagnosis not present

## 2022-12-22 DIAGNOSIS — Z86718 Personal history of other venous thrombosis and embolism: Secondary | ICD-10-CM | POA: Diagnosis not present

## 2022-12-22 DIAGNOSIS — Z7901 Long term (current) use of anticoagulants: Secondary | ICD-10-CM | POA: Diagnosis not present

## 2022-12-22 DIAGNOSIS — Z86711 Personal history of pulmonary embolism: Secondary | ICD-10-CM | POA: Diagnosis not present

## 2022-12-31 DIAGNOSIS — R0602 Shortness of breath: Secondary | ICD-10-CM | POA: Diagnosis not present

## 2022-12-31 DIAGNOSIS — R0789 Other chest pain: Secondary | ICD-10-CM | POA: Diagnosis not present

## 2022-12-31 DIAGNOSIS — Z8249 Family history of ischemic heart disease and other diseases of the circulatory system: Secondary | ICD-10-CM | POA: Diagnosis not present

## 2023-01-20 DIAGNOSIS — H2513 Age-related nuclear cataract, bilateral: Secondary | ICD-10-CM | POA: Diagnosis not present

## 2023-01-20 DIAGNOSIS — H1013 Acute atopic conjunctivitis, bilateral: Secondary | ICD-10-CM | POA: Diagnosis not present

## 2023-01-20 DIAGNOSIS — H524 Presbyopia: Secondary | ICD-10-CM | POA: Diagnosis not present

## 2023-01-31 DIAGNOSIS — I82402 Acute embolism and thrombosis of unspecified deep veins of left lower extremity: Secondary | ICD-10-CM | POA: Diagnosis not present

## 2023-01-31 DIAGNOSIS — Z7901 Long term (current) use of anticoagulants: Secondary | ICD-10-CM | POA: Diagnosis not present

## 2023-01-31 DIAGNOSIS — H5203 Hypermetropia, bilateral: Secondary | ICD-10-CM | POA: Diagnosis not present

## 2023-03-14 DIAGNOSIS — Z86718 Personal history of other venous thrombosis and embolism: Secondary | ICD-10-CM | POA: Diagnosis not present

## 2023-03-14 DIAGNOSIS — Z7901 Long term (current) use of anticoagulants: Secondary | ICD-10-CM | POA: Diagnosis not present

## 2023-03-14 DIAGNOSIS — Z5181 Encounter for therapeutic drug level monitoring: Secondary | ICD-10-CM | POA: Diagnosis not present

## 2023-04-25 DIAGNOSIS — D682 Hereditary deficiency of other clotting factors: Secondary | ICD-10-CM | POA: Diagnosis not present

## 2023-04-25 DIAGNOSIS — M25561 Pain in right knee: Secondary | ICD-10-CM | POA: Diagnosis not present

## 2023-04-25 DIAGNOSIS — Z86718 Personal history of other venous thrombosis and embolism: Secondary | ICD-10-CM | POA: Diagnosis not present

## 2023-04-25 DIAGNOSIS — M7122 Synovial cyst of popliteal space [Baker], left knee: Secondary | ICD-10-CM | POA: Diagnosis not present

## 2023-04-25 DIAGNOSIS — Z5181 Encounter for therapeutic drug level monitoring: Secondary | ICD-10-CM | POA: Diagnosis not present

## 2023-04-25 DIAGNOSIS — Z7901 Long term (current) use of anticoagulants: Secondary | ICD-10-CM | POA: Diagnosis not present

## 2023-04-25 DIAGNOSIS — M170 Bilateral primary osteoarthritis of knee: Secondary | ICD-10-CM | POA: Diagnosis not present

## 2023-04-25 DIAGNOSIS — M25562 Pain in left knee: Secondary | ICD-10-CM | POA: Diagnosis not present

## 2023-04-26 DIAGNOSIS — Z01818 Encounter for other preprocedural examination: Secondary | ICD-10-CM | POA: Diagnosis not present

## 2023-04-26 DIAGNOSIS — Z7901 Long term (current) use of anticoagulants: Secondary | ICD-10-CM | POA: Diagnosis not present

## 2023-04-26 DIAGNOSIS — Z5181 Encounter for therapeutic drug level monitoring: Secondary | ICD-10-CM | POA: Diagnosis not present

## 2023-04-26 DIAGNOSIS — Z86718 Personal history of other venous thrombosis and embolism: Secondary | ICD-10-CM | POA: Diagnosis not present

## 2023-05-18 DIAGNOSIS — M1712 Unilateral primary osteoarthritis, left knee: Secondary | ICD-10-CM | POA: Diagnosis not present

## 2023-05-27 DIAGNOSIS — I10 Essential (primary) hypertension: Secondary | ICD-10-CM | POA: Diagnosis not present

## 2023-05-27 DIAGNOSIS — R5383 Other fatigue: Secondary | ICD-10-CM | POA: Diagnosis not present

## 2023-05-27 DIAGNOSIS — E785 Hyperlipidemia, unspecified: Secondary | ICD-10-CM | POA: Diagnosis not present

## 2023-05-27 DIAGNOSIS — G4730 Sleep apnea, unspecified: Secondary | ICD-10-CM | POA: Diagnosis not present

## 2023-06-06 DIAGNOSIS — R5383 Other fatigue: Secondary | ICD-10-CM | POA: Diagnosis not present

## 2023-07-18 DIAGNOSIS — Z0189 Encounter for other specified special examinations: Secondary | ICD-10-CM | POA: Diagnosis not present

## 2023-08-26 DIAGNOSIS — Z7901 Long term (current) use of anticoagulants: Secondary | ICD-10-CM | POA: Diagnosis not present

## 2023-09-01 DIAGNOSIS — Z96652 Presence of left artificial knee joint: Secondary | ICD-10-CM | POA: Diagnosis not present

## 2023-09-01 DIAGNOSIS — M1712 Unilateral primary osteoarthritis, left knee: Secondary | ICD-10-CM | POA: Diagnosis not present

## 2023-10-03 DIAGNOSIS — Z5181 Encounter for therapeutic drug level monitoring: Secondary | ICD-10-CM | POA: Diagnosis not present

## 2023-10-03 DIAGNOSIS — Z7901 Long term (current) use of anticoagulants: Secondary | ICD-10-CM | POA: Diagnosis not present

## 2023-10-25 DIAGNOSIS — M7122 Synovial cyst of popliteal space [Baker], left knee: Secondary | ICD-10-CM | POA: Diagnosis not present

## 2023-10-25 DIAGNOSIS — D682 Hereditary deficiency of other clotting factors: Secondary | ICD-10-CM | POA: Diagnosis not present

## 2023-10-25 DIAGNOSIS — M170 Bilateral primary osteoarthritis of knee: Secondary | ICD-10-CM | POA: Diagnosis not present

## 2023-10-25 DIAGNOSIS — M1712 Unilateral primary osteoarthritis, left knee: Secondary | ICD-10-CM | POA: Diagnosis not present

## 2023-10-28 DIAGNOSIS — M170 Bilateral primary osteoarthritis of knee: Secondary | ICD-10-CM | POA: Diagnosis not present

## 2023-10-28 DIAGNOSIS — M1712 Unilateral primary osteoarthritis, left knee: Secondary | ICD-10-CM | POA: Diagnosis not present

## 2023-11-08 DIAGNOSIS — Z86718 Personal history of other venous thrombosis and embolism: Secondary | ICD-10-CM | POA: Diagnosis not present

## 2023-11-08 DIAGNOSIS — D6851 Activated protein C resistance: Secondary | ICD-10-CM | POA: Diagnosis not present

## 2023-11-08 DIAGNOSIS — E785 Hyperlipidemia, unspecified: Secondary | ICD-10-CM | POA: Diagnosis not present

## 2023-11-08 DIAGNOSIS — Z86711 Personal history of pulmonary embolism: Secondary | ICD-10-CM | POA: Diagnosis not present

## 2023-11-08 DIAGNOSIS — Z7901 Long term (current) use of anticoagulants: Secondary | ICD-10-CM | POA: Diagnosis not present

## 2023-11-08 DIAGNOSIS — G4733 Obstructive sleep apnea (adult) (pediatric): Secondary | ICD-10-CM | POA: Diagnosis not present

## 2023-11-08 DIAGNOSIS — Z87891 Personal history of nicotine dependence: Secondary | ICD-10-CM | POA: Diagnosis not present

## 2023-11-08 DIAGNOSIS — F39 Unspecified mood [affective] disorder: Secondary | ICD-10-CM | POA: Diagnosis not present

## 2023-11-08 DIAGNOSIS — I10 Essential (primary) hypertension: Secondary | ICD-10-CM | POA: Diagnosis not present

## 2023-11-08 DIAGNOSIS — Z888 Allergy status to other drugs, medicaments and biological substances status: Secondary | ICD-10-CM | POA: Diagnosis not present

## 2023-11-08 DIAGNOSIS — K5900 Constipation, unspecified: Secondary | ICD-10-CM | POA: Diagnosis not present

## 2023-11-08 DIAGNOSIS — R262 Difficulty in walking, not elsewhere classified: Secondary | ICD-10-CM | POA: Diagnosis not present

## 2023-11-08 DIAGNOSIS — M1712 Unilateral primary osteoarthritis, left knee: Secondary | ICD-10-CM | POA: Diagnosis not present

## 2023-11-08 DIAGNOSIS — Z96652 Presence of left artificial knee joint: Secondary | ICD-10-CM | POA: Diagnosis not present

## 2023-11-08 DIAGNOSIS — G40909 Epilepsy, unspecified, not intractable, without status epilepticus: Secondary | ICD-10-CM | POA: Diagnosis not present

## 2023-11-08 DIAGNOSIS — I9581 Postprocedural hypotension: Secondary | ICD-10-CM | POA: Diagnosis not present

## 2023-11-09 DIAGNOSIS — I10 Essential (primary) hypertension: Secondary | ICD-10-CM | POA: Diagnosis not present

## 2023-11-09 DIAGNOSIS — D6851 Activated protein C resistance: Secondary | ICD-10-CM | POA: Diagnosis not present

## 2023-11-09 DIAGNOSIS — M1712 Unilateral primary osteoarthritis, left knee: Secondary | ICD-10-CM | POA: Diagnosis not present

## 2023-11-09 DIAGNOSIS — E785 Hyperlipidemia, unspecified: Secondary | ICD-10-CM | POA: Diagnosis not present

## 2023-11-10 DIAGNOSIS — M1712 Unilateral primary osteoarthritis, left knee: Secondary | ICD-10-CM | POA: Diagnosis not present

## 2023-11-10 DIAGNOSIS — E785 Hyperlipidemia, unspecified: Secondary | ICD-10-CM | POA: Diagnosis not present

## 2023-11-10 DIAGNOSIS — I10 Essential (primary) hypertension: Secondary | ICD-10-CM | POA: Diagnosis not present

## 2023-11-10 DIAGNOSIS — D6851 Activated protein C resistance: Secondary | ICD-10-CM | POA: Diagnosis not present

## 2023-11-11 DIAGNOSIS — M1712 Unilateral primary osteoarthritis, left knee: Secondary | ICD-10-CM | POA: Diagnosis not present

## 2023-11-11 DIAGNOSIS — I10 Essential (primary) hypertension: Secondary | ICD-10-CM | POA: Diagnosis not present

## 2023-11-11 DIAGNOSIS — E785 Hyperlipidemia, unspecified: Secondary | ICD-10-CM | POA: Diagnosis not present

## 2023-11-11 DIAGNOSIS — D6851 Activated protein C resistance: Secondary | ICD-10-CM | POA: Diagnosis not present

## 2023-11-12 DIAGNOSIS — D6851 Activated protein C resistance: Secondary | ICD-10-CM | POA: Diagnosis not present

## 2023-11-12 DIAGNOSIS — E785 Hyperlipidemia, unspecified: Secondary | ICD-10-CM | POA: Diagnosis not present

## 2023-11-12 DIAGNOSIS — I10 Essential (primary) hypertension: Secondary | ICD-10-CM | POA: Diagnosis not present

## 2023-11-12 DIAGNOSIS — M1712 Unilateral primary osteoarthritis, left knee: Secondary | ICD-10-CM | POA: Diagnosis not present

## 2023-11-13 DIAGNOSIS — M1712 Unilateral primary osteoarthritis, left knee: Secondary | ICD-10-CM | POA: Diagnosis not present

## 2023-11-13 DIAGNOSIS — Z96652 Presence of left artificial knee joint: Secondary | ICD-10-CM | POA: Diagnosis not present

## 2023-11-13 DIAGNOSIS — I951 Orthostatic hypotension: Secondary | ICD-10-CM | POA: Diagnosis not present

## 2023-11-13 DIAGNOSIS — Y999 Unspecified external cause status: Secondary | ICD-10-CM | POA: Diagnosis not present

## 2023-11-13 DIAGNOSIS — I10 Essential (primary) hypertension: Secondary | ICD-10-CM | POA: Diagnosis not present

## 2023-11-13 DIAGNOSIS — S8992XA Unspecified injury of left lower leg, initial encounter: Secondary | ICD-10-CM | POA: Diagnosis not present

## 2023-11-13 DIAGNOSIS — E785 Hyperlipidemia, unspecified: Secondary | ICD-10-CM | POA: Diagnosis not present

## 2023-11-14 DIAGNOSIS — I951 Orthostatic hypotension: Secondary | ICD-10-CM | POA: Diagnosis not present

## 2023-11-14 DIAGNOSIS — M179 Osteoarthritis of knee, unspecified: Secondary | ICD-10-CM | POA: Diagnosis not present

## 2023-11-14 DIAGNOSIS — Z471 Aftercare following joint replacement surgery: Secondary | ICD-10-CM | POA: Diagnosis not present

## 2023-11-14 DIAGNOSIS — K5900 Constipation, unspecified: Secondary | ICD-10-CM | POA: Diagnosis not present

## 2023-11-15 DIAGNOSIS — D682 Hereditary deficiency of other clotting factors: Secondary | ICD-10-CM | POA: Diagnosis not present

## 2023-11-15 DIAGNOSIS — F39 Unspecified mood [affective] disorder: Secondary | ICD-10-CM | POA: Diagnosis not present

## 2023-11-15 DIAGNOSIS — I10 Essential (primary) hypertension: Secondary | ICD-10-CM | POA: Diagnosis not present

## 2023-11-15 DIAGNOSIS — E785 Hyperlipidemia, unspecified: Secondary | ICD-10-CM | POA: Diagnosis not present

## 2023-11-16 DIAGNOSIS — Z471 Aftercare following joint replacement surgery: Secondary | ICD-10-CM | POA: Diagnosis not present

## 2023-11-16 DIAGNOSIS — E785 Hyperlipidemia, unspecified: Secondary | ICD-10-CM | POA: Diagnosis not present

## 2023-11-16 DIAGNOSIS — M1712 Unilateral primary osteoarthritis, left knee: Secondary | ICD-10-CM | POA: Diagnosis not present

## 2023-11-16 DIAGNOSIS — G4733 Obstructive sleep apnea (adult) (pediatric): Secondary | ICD-10-CM | POA: Diagnosis not present

## 2023-11-17 DIAGNOSIS — Z96652 Presence of left artificial knee joint: Secondary | ICD-10-CM | POA: Diagnosis not present

## 2023-11-17 DIAGNOSIS — Z471 Aftercare following joint replacement surgery: Secondary | ICD-10-CM | POA: Diagnosis not present

## 2023-11-17 DIAGNOSIS — Z86718 Personal history of other venous thrombosis and embolism: Secondary | ICD-10-CM | POA: Diagnosis not present

## 2023-11-17 DIAGNOSIS — I10 Essential (primary) hypertension: Secondary | ICD-10-CM | POA: Diagnosis not present

## 2023-11-17 DIAGNOSIS — E785 Hyperlipidemia, unspecified: Secondary | ICD-10-CM | POA: Diagnosis not present

## 2023-11-17 DIAGNOSIS — M7989 Other specified soft tissue disorders: Secondary | ICD-10-CM | POA: Diagnosis not present

## 2023-11-20 DIAGNOSIS — Z471 Aftercare following joint replacement surgery: Secondary | ICD-10-CM | POA: Diagnosis not present

## 2023-11-20 DIAGNOSIS — D6851 Activated protein C resistance: Secondary | ICD-10-CM | POA: Diagnosis not present

## 2023-11-20 DIAGNOSIS — I951 Orthostatic hypotension: Secondary | ICD-10-CM | POA: Diagnosis not present

## 2023-11-20 DIAGNOSIS — M1712 Unilateral primary osteoarthritis, left knee: Secondary | ICD-10-CM | POA: Diagnosis not present

## 2023-11-21 DIAGNOSIS — Z86718 Personal history of other venous thrombosis and embolism: Secondary | ICD-10-CM | POA: Diagnosis not present

## 2023-11-21 DIAGNOSIS — Z96652 Presence of left artificial knee joint: Secondary | ICD-10-CM | POA: Diagnosis not present

## 2023-11-21 DIAGNOSIS — Z7901 Long term (current) use of anticoagulants: Secondary | ICD-10-CM | POA: Diagnosis not present

## 2023-11-21 DIAGNOSIS — Z87891 Personal history of nicotine dependence: Secondary | ICD-10-CM | POA: Diagnosis not present

## 2023-11-22 DIAGNOSIS — D6851 Activated protein C resistance: Secondary | ICD-10-CM | POA: Diagnosis not present

## 2023-11-22 DIAGNOSIS — Z96652 Presence of left artificial knee joint: Secondary | ICD-10-CM | POA: Diagnosis not present

## 2023-11-22 DIAGNOSIS — I10 Essential (primary) hypertension: Secondary | ICD-10-CM | POA: Diagnosis not present

## 2023-11-22 DIAGNOSIS — Z471 Aftercare following joint replacement surgery: Secondary | ICD-10-CM | POA: Diagnosis not present

## 2023-11-23 DIAGNOSIS — D6851 Activated protein C resistance: Secondary | ICD-10-CM | POA: Diagnosis not present

## 2023-11-23 DIAGNOSIS — G40909 Epilepsy, unspecified, not intractable, without status epilepticus: Secondary | ICD-10-CM | POA: Diagnosis not present

## 2023-11-23 DIAGNOSIS — Z86718 Personal history of other venous thrombosis and embolism: Secondary | ICD-10-CM | POA: Diagnosis not present

## 2023-11-23 DIAGNOSIS — Z96659 Presence of unspecified artificial knee joint: Secondary | ICD-10-CM | POA: Diagnosis not present

## 2023-11-25 DIAGNOSIS — D649 Anemia, unspecified: Secondary | ICD-10-CM | POA: Diagnosis not present

## 2023-11-25 DIAGNOSIS — Z7901 Long term (current) use of anticoagulants: Secondary | ICD-10-CM | POA: Diagnosis not present

## 2023-11-25 DIAGNOSIS — I951 Orthostatic hypotension: Secondary | ICD-10-CM | POA: Diagnosis not present

## 2023-11-25 DIAGNOSIS — Z471 Aftercare following joint replacement surgery: Secondary | ICD-10-CM | POA: Diagnosis not present

## 2023-11-25 DIAGNOSIS — N170 Acute kidney failure with tubular necrosis: Secondary | ICD-10-CM | POA: Diagnosis not present

## 2023-11-25 DIAGNOSIS — G40909 Epilepsy, unspecified, not intractable, without status epilepticus: Secondary | ICD-10-CM | POA: Diagnosis not present

## 2023-11-25 DIAGNOSIS — E860 Dehydration: Secondary | ICD-10-CM | POA: Diagnosis not present

## 2023-11-25 DIAGNOSIS — Z96652 Presence of left artificial knee joint: Secondary | ICD-10-CM | POA: Diagnosis not present

## 2023-11-25 DIAGNOSIS — Z87891 Personal history of nicotine dependence: Secondary | ICD-10-CM | POA: Diagnosis not present

## 2023-11-25 DIAGNOSIS — I10 Essential (primary) hypertension: Secondary | ICD-10-CM | POA: Diagnosis not present

## 2023-11-25 DIAGNOSIS — D6851 Activated protein C resistance: Secondary | ICD-10-CM | POA: Diagnosis not present

## 2023-11-28 DIAGNOSIS — Z471 Aftercare following joint replacement surgery: Secondary | ICD-10-CM | POA: Diagnosis not present

## 2023-11-28 DIAGNOSIS — R42 Dizziness and giddiness: Secondary | ICD-10-CM | POA: Diagnosis not present

## 2023-11-28 DIAGNOSIS — D6851 Activated protein C resistance: Secondary | ICD-10-CM | POA: Diagnosis not present

## 2023-11-28 DIAGNOSIS — Z96652 Presence of left artificial knee joint: Secondary | ICD-10-CM | POA: Diagnosis not present

## 2023-11-29 DIAGNOSIS — I951 Orthostatic hypotension: Secondary | ICD-10-CM | POA: Diagnosis not present

## 2023-11-30 DIAGNOSIS — I951 Orthostatic hypotension: Secondary | ICD-10-CM | POA: Diagnosis not present

## 2023-11-30 DIAGNOSIS — Z7901 Long term (current) use of anticoagulants: Secondary | ICD-10-CM | POA: Diagnosis not present

## 2023-11-30 DIAGNOSIS — Z96652 Presence of left artificial knee joint: Secondary | ICD-10-CM | POA: Diagnosis not present

## 2023-12-01 DIAGNOSIS — R42 Dizziness and giddiness: Secondary | ICD-10-CM | POA: Diagnosis not present

## 2023-12-01 DIAGNOSIS — Z7901 Long term (current) use of anticoagulants: Secondary | ICD-10-CM | POA: Diagnosis not present

## 2023-12-06 DIAGNOSIS — M1990 Unspecified osteoarthritis, unspecified site: Secondary | ICD-10-CM | POA: Diagnosis not present

## 2023-12-09 DIAGNOSIS — Z0189 Encounter for other specified special examinations: Secondary | ICD-10-CM | POA: Diagnosis not present

## 2023-12-13 DIAGNOSIS — Z5181 Encounter for therapeutic drug level monitoring: Secondary | ICD-10-CM | POA: Diagnosis not present

## 2023-12-13 DIAGNOSIS — Z7901 Long term (current) use of anticoagulants: Secondary | ICD-10-CM | POA: Diagnosis not present

## 2023-12-13 DIAGNOSIS — Z86718 Personal history of other venous thrombosis and embolism: Secondary | ICD-10-CM | POA: Diagnosis not present

## 2023-12-19 DIAGNOSIS — Z7901 Long term (current) use of anticoagulants: Secondary | ICD-10-CM | POA: Diagnosis not present

## 2023-12-19 DIAGNOSIS — Z86718 Personal history of other venous thrombosis and embolism: Secondary | ICD-10-CM | POA: Diagnosis not present

## 2023-12-19 DIAGNOSIS — Z5181 Encounter for therapeutic drug level monitoring: Secondary | ICD-10-CM | POA: Diagnosis not present

## 2023-12-27 DIAGNOSIS — Z0189 Encounter for other specified special examinations: Secondary | ICD-10-CM | POA: Diagnosis not present

## 2024-01-20 ENCOUNTER — Encounter (HOSPITAL_BASED_OUTPATIENT_CLINIC_OR_DEPARTMENT_OTHER): Payer: Self-pay | Admitting: Physical Therapy

## 2024-01-20 ENCOUNTER — Ambulatory Visit (HOSPITAL_BASED_OUTPATIENT_CLINIC_OR_DEPARTMENT_OTHER): Attending: Physician Assistant | Admitting: Physical Therapy

## 2024-01-20 ENCOUNTER — Other Ambulatory Visit: Payer: Self-pay

## 2024-01-20 DIAGNOSIS — M25662 Stiffness of left knee, not elsewhere classified: Secondary | ICD-10-CM | POA: Insufficient documentation

## 2024-01-20 DIAGNOSIS — M25562 Pain in left knee: Secondary | ICD-10-CM | POA: Insufficient documentation

## 2024-01-20 DIAGNOSIS — R2689 Other abnormalities of gait and mobility: Secondary | ICD-10-CM | POA: Insufficient documentation

## 2024-01-20 DIAGNOSIS — R29898 Other symptoms and signs involving the musculoskeletal system: Secondary | ICD-10-CM | POA: Insufficient documentation

## 2024-01-20 DIAGNOSIS — M6281 Muscle weakness (generalized): Secondary | ICD-10-CM | POA: Diagnosis present

## 2024-01-20 NOTE — Therapy (Signed)
 OUTPATIENT PHYSICAL THERAPY LOWER EXTREMITY EVALUATION   Patient Name: Caleb Kirby MRN: [id number] DOB:09/21/1965, 58 y.o., male Today's Date: 01/20/2024  END OF SESSION:  PT End of Session - 01/20/24 1152     Visit Number 1    Number of Visits 16    Date for PT Re-Evaluation 03/16/24    Authorization Type [employer]    Authorization Time Period 15 visits approved from [id number]    Authorization - Visit Number 1    Authorization - Number of Visits 15    PT Start Time 1152    PT Stop Time 1231    PT Time Calculation (min) 39 min    Activity Tolerance Patient tolerated treatment well    Behavior During Therapy WFL for tasks assessed/performed             Past Medical History:  Diagnosis Date   DVT, lower extremity ([facility])    Epilepsy ([facility])    Factor V Leiden ([facility])    Hypertension    Pulmonary embolism ([facility])    Past Surgical History:  Procedure Laterality Date   TYMPANOPLASTY     Patient Active Problem List   Diagnosis Date Noted   Right inguinal pain 02/14/2019   Hematuria 02/14/2019   Hearing loss of right ear due to cerumen impaction 12/04/2018   HTN, goal below 130/80 10/30/2018   Hypercholesteremia 10/30/2018   Nonintractable epilepsy without status epilepticus ([facility]) 10/30/2018   Healthcare maintenance 10/30/2018   Depression, recurrent ([facility]) 10/30/2018   BMI 39.0-39.9,adult 10/30/2018    [facility]  REFERRING PROVIDER: Charlotte Cookey, PA-C  REFERRING DIAG: Z47.1 (ICD-10-CM) - Aftercare following joint replacement surgery - L TKA 11/13/23  THERAPY DIAG:  Left knee pain, unspecified chronicity  Stiffness of left knee, not elsewhere classified  Muscle weakness (generalized)  Other abnormalities of gait and mobility  Other symptoms and signs involving the musculoskeletal system  Rationale for Evaluation and Treatment: Rehabilitation  ONSET DATE: DOS 11/13/23  SUBJECTIVE:   SUBJECTIVE STATEMENT: Patient states he is  behind with rehab. He was over-medicaid and had to spend about a month in the hospital. Too many meds led to BP going down when getting up. Was limited to bed exercises. Has been using knee flexion machine at home. L hip bothers him as well. Pain with being on feet for long periods. Just got new shoes which tends to help.   PERTINENT HISTORY: HTN, Hx DVT/PE, epilepsy  PAIN:  Are you having pain? Yes: NPRS scale: 7/10 with movement Pain location: L knee Pain description: sharp Aggravating factors: bending, initial walking Relieving factors: continued movement  PRECAUTIONS: None  WEIGHT BEARING RESTRICTIONS: No  FALLS:  Has patient fallen in last 6 months? Yes. Number of falls 1  OCCUPATION: [employer]  PLOF: Independent  PATIENT GOALS: get the swelling out of his leg and bend it better so he doesn't have the pain   OBJECTIVE: (objective measures from initial evaluation unless otherwise dated)  PATIENT SURVEYS: LEFS 55/80  COGNITION: Overall cognitive status: Within functional limits for tasks assessed     SENSATION: WFL  EDEMA: LLE gross edema in knee  POSTURE: No Significant postural limitations  PALPATION: Hypomobile patellar glides  LOWER EXTREMITY ROM:  Active ROM Right eval Left eval  Hip flexion    Hip extension    Hip abduction    Hip adduction    Hip internal rotation    Hip external rotation    Knee flexion 130 90  Knee extension  Lacking 8  Ankle dorsiflexion    Ankle plantarflexion    Ankle inversion    Ankle eversion     (Blank rows = not tested) *= pain/symptoms  LOWER EXTREMITY MMT:  MMT Right eval Left eval  Hip flexion 68 34  Hip extension    Hip abduction 52 65  Hip adduction    Hip internal rotation    Hip external rotation    Knee flexion 52.4 45.1  Knee extension 72 78  Ankle dorsiflexion    Ankle plantarflexion    Ankle inversion    Ankle eversion     (Blank rows = not tested) *= pain/symptoms   FUNCTIONAL TESTS:   5 times sit to stand: 20.11 seconds without UE support, relies RLE>LLE Stairs: 7 inch, alternating ascending with LLE circumduction and increased effort required, descending step too initially, able to perform alternating but lacking knee flexion ROM and eccentric strength and control  GAIT: Distance walked: 100 feet Assistive device utilized: None Level of assistance: Complete Independence Comments: antalgic on LLE, lacking TKE, limited knee flexion/extension ROM   TODAY'S TREATMENT:                                                                                                                              DATE:  01/20/24 EVAL, HEP, education    PATIENT EDUCATION:  Education details: Patient educated on exam findings, POC, scope of PT, HEP, relevant anatomy, knee positioning. Person educated: Patient Education method: Explanation, Demonstration, and Handouts Education comprehension: verbalized understanding, returned demonstration, verbal cues required, and tactile cues required  HOME EXERCISE PROGRAM: Access Code: [email or web address] URL: [email or web address] Date: 01/20/2024 Prepared by: Debria Fang Campbell Kray  Exercises - Supine Heel Slide with Strap  - 3-5 x daily - 7 x weekly - 2 sets - 10 reps - 5-10 second hold - Long Sitting Quad Set with Towel Roll Under Heel  - 3-5 x daily - 7 x weekly - 10 reps - 10 second hold - Seated Long Arc Quad (Mirrored)  - 3-5 x daily - 7 x weekly - 2 sets - 10 reps - 5-10 second hold  ASSESSMENT:  CLINICAL IMPRESSION: Patient a 58 y.o. y.o. male who was seen today for physical therapy evaluation and treatment for s/p L TKA on 11/13/23. Patient presents with pain limited deficits in L knee strength, ROM, endurance, activity tolerance, gait, balance, and functional mobility with ADL. Patient is having to modify and restrict ADL as indicated by outcome measure score as well as subjective information and objective measures which is affecting overall  participation. Patient will benefit from skilled physical therapy in order to improve function and reduce impairment.  OBJECTIVE IMPAIRMENTS: Abnormal gait, decreased activity tolerance, decreased balance, decreased endurance, decreased mobility, difficulty walking, decreased ROM, decreased strength, increased edema, increased muscle spasms, improper body mechanics, and pain.   ACTIVITY LIMITATIONS: carrying, lifting, bending, standing, squatting, sleeping, stairs, transfers, bathing, dressing, hygiene/grooming, locomotion level, and caring  for others  PARTICIPATION LIMITATIONS: meal prep, cleaning, laundry, shopping, community activity, occupation, and yard work  PERSONAL FACTORS: Fitness, Time since onset of injury/illness/exacerbation, and 3+ comorbidities: HTN, Hx DVT/PE, epilepsy  are also affecting patient's functional outcome.   REHAB POTENTIAL: Good  CLINICAL DECISION MAKING: Evolving/moderate complexity  EVALUATION COMPLEXITY: Moderate   GOALS: Goals reviewed with patient? Yes  SHORT TERM GOALS: Target date: 02/17/2024    Patient will be independent with HEP in order to improve functional outcomes. Baseline: Goal status: INITIAL  2.  Patient will report at least 25% improvement in symptoms for improved quality of life. Baseline: Goal status: INITIAL    LONG TERM GOALS: Target date: 03/16/2024    Patient will report at least 75% improvement in symptoms for improved quality of life. Baseline:  Goal status: INITIAL  2.  Patient will improve LEFSscore by at least 9 points in order to indicate improved tolerance to activity. Baseline: 55/80 Goal status: INITIAL  3.  Patient will be able to navigate stairs with reciprocal pattern without compensation in order to demonstrate improved LE strength. Baseline:  Goal status: INITIAL  4.  Patient will improve ROM for L knee extension/flexion to 0-115 degrees to improve squatting, and other functional mobility. Baseline:   Goal status: INITIAL  5.  Patient will be able to complete 5x STS in under 12 seconds in order to  demonstrate improved functional strength. Baseline:  Goal status: INITIAL  6.  Patient will demonstrate improvement of 10# with HHD in all tested musculature as evidence of improved strength to assist with stair ambulation and gait.   Baseline:  Goal status: INITIAL   PLAN:  PT FREQUENCY: 2x/week  PT DURATION: 8 weeks  PLANNED INTERVENTIONS: [id number]- PT Re-evaluation, 97110-Therapeutic exercises, 97530- Therapeutic activity, 97112- Neuromuscular re-education, 97535- Self Care, [id number]- Manual therapy, [phone]- Gait training, [phone]- Orthotic Fit/training, [phone]- Canalith repositioning, [id number]- Aquatic Therapy, [phone]- Splinting, Patient/Family education, Balance training, Stair training, Taping, Dry Needling, Joint mobilization, Joint manipulation, Spinal manipulation, Spinal mobilization, Scar mobilization, and DME instructions.  PLAN FOR NEXT SESSION: progress L knee mobility and strength, manual for edema and mobility   Beather Liming, PT 01/20/2024, 12:38 PM

## 2024-01-23 ENCOUNTER — Ambulatory Visit (HOSPITAL_BASED_OUTPATIENT_CLINIC_OR_DEPARTMENT_OTHER): Admitting: Physical Therapy

## 2024-01-23 ENCOUNTER — Encounter (HOSPITAL_BASED_OUTPATIENT_CLINIC_OR_DEPARTMENT_OTHER): Payer: Self-pay | Admitting: Physical Therapy

## 2024-01-23 DIAGNOSIS — M25562 Pain in left knee: Secondary | ICD-10-CM

## 2024-01-23 DIAGNOSIS — M25662 Stiffness of left knee, not elsewhere classified: Secondary | ICD-10-CM

## 2024-01-23 DIAGNOSIS — M6281 Muscle weakness (generalized): Secondary | ICD-10-CM

## 2024-01-23 NOTE — Therapy (Signed)
 OUTPATIENT PHYSICAL THERAPY LOWER EXTREMITY TREATMENT   Patient Name: Caleb Kirby MRN: [id number] DOB:18-Nov-1965, 58 y.o., male Today's Date: 01/23/2024  END OF SESSION:  PT End of Session - 01/23/24 1528     Visit Number 2    Number of Visits 16    Date for PT Re-Evaluation 03/16/24    Authorization Type [facility]    Authorization Time Period 15 visits approved from [id number]    Authorization - Number of Visits 15    PT Start Time 1528    PT Stop Time 1610    PT Time Calculation (min) 42 min    Activity Tolerance Patient tolerated treatment well    Behavior During Therapy WFL for tasks assessed/performed              Past Medical History:  Diagnosis Date   DVT, lower extremity (HCC)    Epilepsy (HCC)    Factor V Leiden (HCC)    Hypertension    Pulmonary embolism (HCC)    Past Surgical History:  Procedure Laterality Date   TYMPANOPLASTY     Patient Active Problem List   Diagnosis Date Noted   Right inguinal pain 02/14/2019   Hematuria 02/14/2019   Hearing loss of right ear due to cerumen impaction 12/04/2018   HTN, goal below 130/80 10/30/2018   Hypercholesteremia 10/30/2018   Nonintractable epilepsy without status epilepticus (HCC) 10/30/2018   Healthcare maintenance 10/30/2018   Depression, recurrent (HCC) 10/30/2018   BMI 39.0-39.9,adult 10/30/2018    [facility]  REFERRING PROVIDER: Charlotte Cookey, PA-C  REFERRING DIAG: Z47.1 (ICD-10-CM) - Aftercare following joint replacement surgery - L TKA 11/13/23  THERAPY DIAG:  Left knee pain, unspecified chronicity  Stiffness of left knee, not elsewhere classified  Muscle weakness (generalized)  Rationale for Evaluation and Treatment: Rehabilitation  ONSET DATE: DOS 11/13/23  SUBJECTIVE:   SUBJECTIVE STATEMENT: My leg is swelling. Range feels good- able to get out of firm chairs but cushion chairs I need to use my hands. 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 I think my left hip is giving me more issues than my knee.   PERTINENT HISTORY: HTN, Hx DVT/PE, epilepsy  PAIN:  Are you having pain? Yes: NPRS scale: 7/10 with movement Pain location: L knee Pain description: sharp Aggravating factors: bending, initial walking Relieving factors: continued movement  PRECAUTIONS: None  WEIGHT BEARING RESTRICTIONS: No  FALLS:  Has patient fallen in last 6 months? Yes. Number of falls 1  OCCUPATION: [employer]  PLOF: Independent  PATIENT GOALS: get the swelling out of his leg and bend it better so he doesn't have the pain   OBJECTIVE: (objective measures from initial evaluation unless otherwise dated)  PATIENT SURVEYS: LEFS 55/80  COGNITION: Overall cognitive status: Within functional limits for tasks assessed     SENSATION: WFL  EDEMA: LLE gross edema in knee  POSTURE: No Significant postural limitations  PALPATION: Hypomobile patellar glides  LOWER EXTREMITY ROM:  Active ROM Right eval Left eval  Hip flexion    Hip extension    Hip abduction    Hip adduction    Hip internal rotation    Hip external rotation    Knee flexion 130 90  Knee extension  Lacking 8  Ankle dorsiflexion    Ankle plantarflexion    Ankle inversion    Ankle eversion     (Blank rows = not tested) *= pain/symptoms  LOWER EXTREMITY  MMT:  MMT Right eval Left eval  Hip flexion 68 34  Hip extension    Hip abduction 52 65  Hip adduction    Hip internal rotation    Hip external rotation    Knee flexion 52.4 45.1  Knee extension 72 78  Ankle dorsiflexion    Ankle plantarflexion    Ankle inversion    Ankle eversion     (Blank rows = not tested) *= pain/symptoms   FUNCTIONAL TESTS:  5 times sit to stand: 20.11 seconds without UE support, relies RLE>LLE Stairs: 7 inch, alternating ascending with LLE circumduction and increased effort required, descending step too initially, able to perform alternating but lacking  knee flexion ROM and eccentric strength and control  GAIT: Distance walked: 100 feet Assistive device utilized: None Level of assistance: Complete Independence Comments: antalgic on LLE, lacking TKE, limited knee flexion/extension ROM   TODAY'S TREATMENT:                                                                                                                              DATE:  Treatment                            5/5: Blank lines following charge title = not provided on this treatment date.   Manual:  TPDN No Roller to hamstrings & gastroc There-ex: Nu step 5 min L5 Quad set to SLR for TKE SL hip abd x30 Bridge 5*5 with feet moving closer each set Seated hamstring stretch (achieves -3 deg) TKE blue tband SLS with quad/glut set There-Act:  Self Care:  Nuro-Re-ed:  Gait Training: Full extension at heel strike Step over hurdle for knee flexion Fwd and retro stepping    PATIENT EDUCATION:  Education details: Patient educated on exam findings, POC, scope of PT, HEP, relevant anatomy, knee positioning. Person educated: Patient Education method: Explanation, Demonstration, and Handouts Education comprehension: verbalized understanding, returned demonstration, verbal cues required, and tactile cues required  HOME EXERCISE PROGRAM: Access Code: [email or web address] URL: [email or web address] Date: 01/20/2024 Prepared by: Debria Fang Zaunegger  Exercises - Supine Heel Slide with Strap  - 3-5 x daily - 7 x weekly - 2 sets - 10 reps - 5-10 second hold - Long Sitting Quad Set with Towel Roll Under Heel  - 3-5 x daily - 7 x weekly - 10 reps - 10 second hold - Seated Long Arc Quad (Mirrored)  - 3-5 x daily - 7 x weekly - 2 sets - 10 reps - 5-10 second hold  ASSESSMENT:  CLINICAL IMPRESSION: ROM improving and focused on TKE for proper gait pattern today.   OBJECTIVE IMPAIRMENTS: Abnormal gait, decreased activity tolerance, decreased balance, decreased endurance,  decreased mobility, difficulty walking, decreased ROM, decreased strength, increased edema, increased muscle spasms, improper body mechanics, and pain.   ACTIVITY LIMITATIONS: carrying, lifting, bending, standing, squatting, sleeping, stairs, transfers, bathing, dressing, hygiene/grooming, locomotion level, and caring for  others  PARTICIPATION LIMITATIONS: meal prep, cleaning, laundry, shopping, community activity, occupation, and yard work  PERSONAL FACTORS: Fitness, Time since onset of injury/illness/exacerbation, and 3+ comorbidities: HTN, Hx DVT/PE, epilepsy  are also affecting patient's functional outcome.   REHAB POTENTIAL: Good  CLINICAL DECISION MAKING: Evolving/moderate complexity  EVALUATION COMPLEXITY: Moderate   GOALS: Goals reviewed with patient? Yes  SHORT TERM GOALS: Target date: 02/17/2024    Patient will be independent with HEP in order to improve functional outcomes. Baseline: Goal status: INITIAL  2.  Patient will report at least 25% improvement in symptoms for improved quality of life. Baseline: Goal status: INITIAL    LONG TERM GOALS: Target date: 03/16/2024    Patient will report at least 75% improvement in symptoms for improved quality of life. Baseline:  Goal status: INITIAL  2.  Patient will improve LEFSscore by at least 9 points in order to indicate improved tolerance to activity. Baseline: 55/80 Goal status: INITIAL  3.  Patient will be able to navigate stairs with reciprocal pattern without compensation in order to demonstrate improved LE strength. Baseline:  Goal status: INITIAL  4.  Patient will improve ROM for L knee extension/flexion to 0-115 degrees to improve squatting, and other functional mobility. Baseline:  Goal status: INITIAL  5.  Patient will be able to complete 5x STS in under 12 seconds in order to  demonstrate improved functional strength. Baseline:  Goal status: INITIAL  6.  Patient will demonstrate improvement of 10#  with HHD in all tested musculature as evidence of improved strength to assist with stair ambulation and gait.   Baseline:  Goal status: INITIAL   PLAN:  PT FREQUENCY: 2x/week  PT DURATION: 8 weeks  PLANNED INTERVENTIONS: 97164- PT Re-evaluation, 97110-Therapeutic exercises, 97530- Therapeutic activity, 97112- Neuromuscular re-education, 97535- Self Care, 82956- Manual therapy, [phone]- Gait training, [phone]- Orthotic Fit/training, [phone]- Canalith repositioning, [id number]- Aquatic Therapy, [phone]- Splinting, Patient/Family education, Balance training, Stair training, Taping, Dry Needling, Joint mobilization, Joint manipulation, Spinal manipulation, Spinal mobilization, Scar mobilization, and DME instructions.  PLAN FOR NEXT SESSION: progress L knee mobility and strength, manual for edema and mobility   Claudie Brickhouse C. Onesti Bonfiglio PT, DPT 01/23/24 4:11 PM

## 2024-01-24 DIAGNOSIS — D6851 Activated protein C resistance: Secondary | ICD-10-CM | POA: Diagnosis not present

## 2024-01-24 DIAGNOSIS — Z5181 Encounter for therapeutic drug level monitoring: Secondary | ICD-10-CM | POA: Diagnosis not present

## 2024-01-24 DIAGNOSIS — Z7901 Long term (current) use of anticoagulants: Secondary | ICD-10-CM | POA: Diagnosis not present

## 2024-01-24 DIAGNOSIS — Z86718 Personal history of other venous thrombosis and embolism: Secondary | ICD-10-CM | POA: Diagnosis not present

## 2024-01-28 ENCOUNTER — Encounter (HOSPITAL_BASED_OUTPATIENT_CLINIC_OR_DEPARTMENT_OTHER): Payer: Self-pay | Admitting: Physical Therapy

## 2024-01-28 ENCOUNTER — Ambulatory Visit (HOSPITAL_BASED_OUTPATIENT_CLINIC_OR_DEPARTMENT_OTHER): Admitting: Physical Therapy

## 2024-01-28 DIAGNOSIS — M25662 Stiffness of left knee, not elsewhere classified: Secondary | ICD-10-CM

## 2024-01-28 DIAGNOSIS — M25562 Pain in left knee: Secondary | ICD-10-CM

## 2024-01-28 DIAGNOSIS — M6281 Muscle weakness (generalized): Secondary | ICD-10-CM

## 2024-01-28 NOTE — Therapy (Signed)
 OUTPATIENT PHYSICAL THERAPY LOWER EXTREMITY TREATMENT   Patient Name: Caleb Kirby MRN: [id number] DOB:1966-05-26, 58 y.o., male Today's Date: 01/28/2024  END OF SESSION:  PT End of Session - 01/28/24 1227     Visit Number 3    Number of Visits 16    Date for PT Re-Evaluation 03/16/24    Authorization Type [facility]    Authorization Time Period 15 visits approved from 4.10.25-8.8.25    Authorization - Number of Visits 15    PT Start Time 1225    PT Stop Time 1304    PT Time Calculation (min) 39 min    Activity Tolerance Patient tolerated treatment well    Behavior During Therapy WFL for tasks assessed/performed              Past Medical History:  Diagnosis Date   DVT, lower extremity (HCC)    Epilepsy (HCC)    Factor V Leiden (HCC)    Hypertension    Pulmonary embolism (HCC)    Past Surgical History:  Procedure Laterality Date   TYMPANOPLASTY     Patient Active Problem List   Diagnosis Date Noted   Right inguinal pain 02/14/2019   Hematuria 02/14/2019   Hearing loss of right ear due to cerumen impaction 12/04/2018   HTN, goal below 130/80 10/30/2018   Hypercholesteremia 10/30/2018   Nonintractable epilepsy without status epilepticus (HCC) 10/30/2018   Healthcare maintenance 10/30/2018   Depression, recurrent (HCC) 10/30/2018   BMI 39.0-39.9,adult 10/30/2018    [facility]  REFERRING PROVIDER: Charlotte Cookey, PA-C  REFERRING DIAG: Z47.1 (ICD-10-CM) - Aftercare following joint replacement surgery - L TKA 11/13/23  THERAPY DIAG:  Left knee pain, unspecified chronicity  Stiffness of left knee, not elsewhere classified  Muscle weakness (generalized)  Rationale for Evaluation and Treatment: Rehabilitation  ONSET DATE: DOS 11/13/23  SUBJECTIVE:   SUBJECTIVE STATEMENT: 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   PERTINENT HISTORY: HTN, Hx DVT/PE, epilepsy  PAIN:  Are  you having pain? Yes: NPRS scale: 7/10 with movement Pain location: L knee Pain description: sharp Aggravating factors: bending, initial walking Relieving factors: continued movement  PRECAUTIONS: None  WEIGHT BEARING RESTRICTIONS: No  FALLS:  Has patient fallen in last 6 months? Yes. Number of falls 1  OCCUPATION: [employer]  PLOF: Independent  PATIENT GOALS: get the swelling out of his leg and bend it better so he doesn't have the pain   OBJECTIVE: (objective measures from initial evaluation unless otherwise dated)  PATIENT SURVEYS: LEFS 55/80  COGNITION: Overall cognitive status: Within functional limits for tasks assessed     SENSATION: WFL  EDEMA: LLE gross edema in knee  POSTURE: No Significant postural limitations  PALPATION: Hypomobile patellar glides  LOWER EXTREMITY ROM:  Active ROM Right eval Left eval  Hip flexion    Hip extension    Hip abduction    Hip adduction    Hip internal rotation    Hip external rotation    Knee flexion 130 90  Knee extension  Lacking 8  Ankle dorsiflexion    Ankle plantarflexion    Ankle inversion    Ankle eversion     (Blank rows = not tested) *= pain/symptoms  LOWER EXTREMITY MMT:  MMT Right eval Left eval  Hip flexion 68 34  Hip extension    Hip abduction 52 65  Hip adduction    Hip internal rotation  Hip external rotation    Knee flexion 52.4 45.1  Knee extension 72 78  Ankle dorsiflexion    Ankle plantarflexion    Ankle inversion    Ankle eversion     (Blank rows = not tested) *= pain/symptoms   FUNCTIONAL TESTS:  5 times sit to stand: 20.11 seconds without UE support, relies RLE>LLE Stairs: 7 inch, alternating ascending with LLE circumduction and increased effort required, descending step too initially, able to perform alternating but lacking knee flexion ROM and eccentric strength and control  GAIT: Distance walked: 100 feet Assistive device utilized: None Level of assistance: Complete  Independence Comments: antalgic on LLE, lacking TKE, limited knee flexion/extension ROM   TODAY'S TREATMENT:                                                                                                                              DATE:  Treatment                            5/10: Blank lines following charge title = not provided on this treatment date.   Manual:  TPDN No Rolller & STM to Lt hip and lateral thigh Passive HS & ITB stretch There-ex: Supine HS & ITB stretch with strap Supine AHSS for knee flexion Seated self knee flexion mob SLS Lateral step up 2" Heel raises with quad/glut set There-Act:  Self Care:  Nuro-Re-ed:  Gait Training:    Treatment                            5/5: Blank lines following charge title = not provided on this treatment date.   Manual:  TPDN No Roller to hamstrings & gastroc There-ex: Nu step 5 min L5 Quad set to SLR for TKE SL hip abd x30 Bridge 5*5 with feet moving closer each set Seated hamstring stretch (achieves -3 deg) TKE blue tband SLS with quad/glut set There-Act:  Self Care:  Nuro-Re-ed:  Gait Training: Full extension at heel strike Step over hurdle for knee flexion Fwd and retro stepping    PATIENT EDUCATION:  Education details: Patient educated on exam findings, POC, scope of PT, HEP, relevant anatomy, knee positioning. Person educated: Patient Education method: Explanation, Demonstration, and Handouts Education comprehension: verbalized understanding, returned demonstration, verbal cues required, and tactile cues required  HOME EXERCISE PROGRAM: Access Code: [email or web address] URL: [email or web address]   ASSESSMENT:  CLINICAL IMPRESSION: Knee flexion to 100 deg today- will focus on home mobilizations paired with strength. Able to decrease some tightness in hip.   OBJECTIVE IMPAIRMENTS: Abnormal gait, decreased activity tolerance, decreased balance, decreased endurance, decreased mobility,  difficulty walking, decreased ROM, decreased strength, increased edema, increased muscle spasms, improper body mechanics, and pain.   ACTIVITY LIMITATIONS: carrying, lifting, bending, standing, squatting, sleeping, stairs, transfers, bathing, dressing, hygiene/grooming, locomotion level, and caring for others  PARTICIPATION LIMITATIONS: meal prep,  cleaning, laundry, shopping, community activity, occupation, and yard work  PERSONAL FACTORS: Fitness, Time since onset of injury/illness/exacerbation, and 3+ comorbidities: HTN, Hx DVT/PE, epilepsy are also affecting patient's functional outcome.   REHAB POTENTIAL: Good  CLINICAL DECISION MAKING: Evolving/moderate complexity  EVALUATION COMPLEXITY: Moderate   GOALS: Goals reviewed with patient? Yes  SHORT TERM GOALS: Target date: 02/17/2024    Patient will be independent with HEP in order to improve functional outcomes. Baseline: Goal status: INITIAL  2.  Patient will report at least 25% improvement in symptoms for improved quality of life. Baseline: Goal status: INITIAL    LONG TERM GOALS: Target date: 03/16/2024    Patient will report at least 75% improvement in symptoms for improved quality of life. Baseline:  Goal status: INITIAL  2.  Patient will improve LEFSscore by at least 9 points in order to indicate improved tolerance to activity. Baseline: 55/80 Goal status: INITIAL  3.  Patient will be able to navigate stairs with reciprocal pattern without compensation in order to demonstrate improved LE strength. Baseline:  Goal status: INITIAL  4.  Patient will improve ROM for L knee extension/flexion to 0-115 degrees to improve squatting, and other functional mobility. Baseline:  Goal status: INITIAL  5.  Patient will be able to complete 5x STS in under 12 seconds in order to  demonstrate improved functional strength. Baseline:  Goal status: INITIAL  6.  Patient will demonstrate improvement of 10# with HHD in all tested  musculature as evidence of improved strength to assist with stair ambulation and gait.   Baseline:  Goal status: INITIAL   PLAN:  PT FREQUENCY: 2x/week  PT DURATION: 8 weeks  PLANNED INTERVENTIONS: 97164- PT Re-evaluation, 97110-Therapeutic exercises, 97530- Therapeutic activity, 97112- Neuromuscular re-education, 97535- Self Care, [id number]- Manual therapy, [phone]- Gait training, [phone]- Orthotic Fit/training, [phone]- Canalith repositioning, [id number]- Aquatic Therapy, [phone]- Splinting, Patient/Family education, Balance training, Stair training, Taping, Dry Needling, Joint mobilization, Joint manipulation, Spinal manipulation, Spinal mobilization, Scar mobilization, and DME instructions.  PLAN FOR NEXT SESSION: progress L knee mobility and strength, manual for edema and mobility   Blythe Veach C. Carmichael Burdette PT, DPT 01/28/24 1:06 PM

## 2024-02-03 ENCOUNTER — Encounter (HOSPITAL_BASED_OUTPATIENT_CLINIC_OR_DEPARTMENT_OTHER): Payer: Self-pay | Admitting: Physical Therapy

## 2024-02-03 ENCOUNTER — Ambulatory Visit (HOSPITAL_BASED_OUTPATIENT_CLINIC_OR_DEPARTMENT_OTHER): Admitting: Physical Therapy

## 2024-02-03 DIAGNOSIS — M6281 Muscle weakness (generalized): Secondary | ICD-10-CM

## 2024-02-03 DIAGNOSIS — M25562 Pain in left knee: Secondary | ICD-10-CM

## 2024-02-03 DIAGNOSIS — Z7901 Long term (current) use of anticoagulants: Secondary | ICD-10-CM | POA: Diagnosis not present

## 2024-02-03 DIAGNOSIS — M25662 Stiffness of left knee, not elsewhere classified: Secondary | ICD-10-CM

## 2024-02-03 NOTE — Therapy (Signed)
 OUTPATIENT PHYSICAL THERAPY LOWER EXTREMITY TREATMENT   Patient Name: Caleb Kirby MRN: [id number] DOB:09-10-1966, 58 y.o., male Today's Date: 02/03/2024  END OF SESSION:  PT End of Session - 02/03/24 1050     Visit Number 4    Number of Visits 16    Date for PT Re-Evaluation 03/16/24    Authorization Type [facility]    Authorization Time Period 15 visits approved from [id number]    Authorization - Number of Visits 15    PT Start Time 1100    PT Stop Time 1144    PT Time Calculation (min) 44 min    Activity Tolerance Patient tolerated treatment well    Behavior During Therapy WFL for tasks assessed/performed              Past Medical History:  Diagnosis Date   DVT, lower extremity (HCC)    Epilepsy (HCC)    Factor V Leiden (HCC)    Hypertension    Pulmonary embolism (HCC)    Past Surgical History:  Procedure Laterality Date   TYMPANOPLASTY     Patient Active Problem List   Diagnosis Date Noted   Right inguinal pain 02/14/2019   Hematuria 02/14/2019   Hearing loss of right ear due to cerumen impaction 12/04/2018   HTN, goal below 130/80 10/30/2018   Hypercholesteremia 10/30/2018   Nonintractable epilepsy without status epilepticus (HCC) 10/30/2018   Healthcare maintenance 10/30/2018   Depression, recurrent (HCC) 10/30/2018   BMI 39.0-39.9,adult 10/30/2018    [facility]  REFERRING PROVIDER: Charlotte Cookey, PA-C  REFERRING DIAG: Z47.1 (ICD-10-CM) - Aftercare following joint replacement surgery - L TKA 11/13/23  THERAPY DIAG:  Left knee pain, unspecified chronicity  Stiffness of left knee, not elsewhere classified  Muscle weakness (generalized)  Rationale for Evaluation and Treatment: Rehabilitation  ONSET DATE: DOS 11/13/23  SUBJECTIVE:   SUBJECTIVE STATEMENT: Pt states that knee is still stiff. The hip seems to be bothering him more than the knee itself. He has been sleeping on his stomach has been helping with the knee  straightening.   PERTINENT HISTORY: HTN, Hx DVT/PE, epilepsy  PAIN:  Are you having pain? No: NPRS scale:  0/10 at rest Pain location: L knee Pain description: sharp Aggravating factors: bending, initial walking Relieving factors: continued movement  PRECAUTIONS: None  WEIGHT BEARING RESTRICTIONS: No  FALLS:  Has patient fallen in last 6 months? Yes. Number of falls 1  OCCUPATION: [employer]  PLOF: Independent  PATIENT GOALS: get the swelling out of his leg and bend it better so he doesn't have the pain   OBJECTIVE: (objective measures from initial evaluation unless otherwise dated)  PATIENT SURVEYS: LEFS 55/80  COGNITION: Overall cognitive status: Within functional limits for tasks assessed     SENSATION: WFL  EDEMA: LLE gross edema in knee  POSTURE: No Significant postural limitations  PALPATION: Hypomobile patellar glides  LOWER EXTREMITY ROM:  Active ROM Right eval Left eval  Hip flexion    Hip extension    Hip abduction    Hip adduction    Hip internal rotation    Hip external rotation    Knee flexion 130 90  Knee extension  Lacking 8  Ankle dorsiflexion    Ankle plantarflexion    Ankle inversion    Ankle eversion     (Blank rows = not tested) *= pain/symptoms  LOWER EXTREMITY MMT:  MMT Right eval Left eval  Hip flexion 68 34  Hip extension  Hip abduction 52 65  Hip adduction    Hip internal rotation    Hip external rotation    Knee flexion 52.4 45.1  Knee extension 72 78  Ankle dorsiflexion    Ankle plantarflexion    Ankle inversion    Ankle eversion     (Blank rows = not tested) *= pain/symptoms   FUNCTIONAL TESTS:  5 times sit to stand: 20.11 seconds without UE support, relies RLE>LLE Stairs: 7 inch, alternating ascending with LLE circumduction and increased effort required, descending step too initially, able to perform alternating but lacking knee flexion ROM and eccentric strength and control  GAIT: Distance walked:  100 feet Assistive device utilized: None Level of assistance: Complete Independence Comments: antalgic on LLE, lacking TKE, limited knee flexion/extension ROM   TODAY'S TREATMENT:                                                                                                                              DATE:  5/16  Recumbent bike half revs 10s holds 6 min  Calf stretch with quad set 5s 2x12 Bridge with Abd holds GTB 5s 10x Seated AAROM flexion 10s 15x STS from tall table with GTB 2x10 Shuttle leg press 75lbs 3x8 (pause at bottom at max tolerated knee flexion)      Treatment                            5/10: Blank lines following charge title = not provided on this treatment date.   Manual:  TPDN No Rolller & STM to Lt hip and lateral thigh Passive HS & ITB stretch There-ex: Supine HS & ITB stretch with strap Supine AHSS for knee flexion Seated self knee flexion mob SLS Lateral step up 2" Heel raises with quad/glut set There-Act:  Self Care:  Nuro-Re-ed:  Gait Training:    Treatment                            5/5: Blank lines following charge title = not provided on this treatment date.   Manual:  TPDN No Roller to hamstrings & gastroc There-ex: Nu step 5 min L5 Quad set to SLR for TKE SL hip abd x30 Bridge 5*5 with feet moving closer each set Seated hamstring stretch (achieves -3 deg) TKE blue tband SLS with quad/glut set There-Act:  Self Care:  Nuro-Re-ed:  Gait Training: Full extension at heel strike Step over hurdle for knee flexion Fwd and retro stepping    PATIENT EDUCATION:  Education details: Patient educated on exam findings, POC, scope of PT, HEP, relevant anatomy, knee positioning. Person educated: Patient Education method: Explanation, Demonstration, and Handouts Education comprehension: verbalized understanding, returned demonstration, verbal cues required, and tactile cues required  HOME EXERCISE PROGRAM: Access Code:  [email or web address] URL: [email or web address]   ASSESSMENT:  CLINICAL IMPRESSION: Pt able to reach 0 deg of  ext following self stretch  pof posterior knee and quad set. Hip pian improves with isometrics holds. Flexion up to 103 by end of session. Plan to continue with knee ROM focus as pt is still greatly limited with community ambulation and work related tasks. Pt advised on LLLD stretching for knee flexion at home and set up for getting full TKE.   OBJECTIVE IMPAIRMENTS: Abnormal gait, decreased activity tolerance, decreased balance, decreased endurance, decreased mobility, difficulty walking, decreased ROM, decreased strength, increased edema, increased muscle spasms, improper body mechanics, and pain.   ACTIVITY LIMITATIONS: carrying, lifting, bending, standing, squatting, sleeping, stairs, transfers, bathing, dressing, hygiene/grooming, locomotion level, and caring for others  PARTICIPATION LIMITATIONS: meal prep, cleaning, laundry, shopping, community activity, occupation, and yard work  PERSONAL FACTORS: Fitness, Time since onset of injury/illness/exacerbation, and 3+ comorbidities: HTN, Hx DVT/PE, epilepsy are also affecting patient's functional outcome.   REHAB POTENTIAL: Good  CLINICAL DECISION MAKING: Evolving/moderate complexity  EVALUATION COMPLEXITY: Moderate   GOALS: Goals reviewed with patient? Yes  SHORT TERM GOALS: Target date: 02/17/2024    Patient will be independent with HEP in order to improve functional outcomes. Baseline: Goal status: INITIAL  2.  Patient will report at least 25% improvement in symptoms for improved quality of life. Baseline: Goal status: INITIAL    LONG TERM GOALS: Target date: 03/16/2024    Patient will report at least 75% improvement in symptoms for improved quality of life. Baseline:  Goal status: INITIAL  2.  Patient will improve LEFSscore by at least 9 points in order to indicate improved tolerance to activity. Baseline:  55/80 Goal status: INITIAL  3.  Patient will be able to navigate stairs with reciprocal pattern without compensation in order to demonstrate improved LE strength. Baseline:  Goal status: INITIAL  4.  Patient will improve ROM for L knee extension/flexion to 0-115 degrees to improve squatting, and other functional mobility. Baseline:  Goal status: INITIAL  5.  Patient will be able to complete 5x STS in under 12 seconds in order to  demonstrate improved functional strength. Baseline:  Goal status: INITIAL  6.  Patient will demonstrate improvement of 10# with HHD in all tested musculature as evidence of improved strength to assist with stair ambulation and gait.   Baseline:  Goal status: INITIAL   PLAN:  PT FREQUENCY: 2x/week  PT DURATION: 8 weeks  PLANNED INTERVENTIONS: 97164- PT Re-evaluation, 97110-Therapeutic exercises, 97530- Therapeutic activity, 97112- Neuromuscular re-education, 97535- Self Care, 04540- Manual therapy, [phone]- Gait training, [phone]- Orthotic Fit/training, [phone]- Canalith repositioning, [id number]- Aquatic Therapy, [phone]- Splinting, Patient/Family education, Balance training, Stair training, Taping, Dry Needling, Joint mobilization, Joint manipulation, Spinal manipulation, Spinal mobilization, Scar mobilization, and DME instructions.  PLAN FOR NEXT SESSION: progress L knee mobility and strength, manual for edema and mobility  Silver Dross PT, DPT 02/03/24 11:44 AM

## 2024-02-18 ENCOUNTER — Encounter (HOSPITAL_BASED_OUTPATIENT_CLINIC_OR_DEPARTMENT_OTHER): Payer: Self-pay

## 2024-02-18 ENCOUNTER — Ambulatory Visit (HOSPITAL_BASED_OUTPATIENT_CLINIC_OR_DEPARTMENT_OTHER): Payer: Self-pay | Admitting: Physical Therapy

## 2024-02-21 ENCOUNTER — Encounter (HOSPITAL_BASED_OUTPATIENT_CLINIC_OR_DEPARTMENT_OTHER): Payer: Self-pay | Admitting: Physical Therapy

## 2024-02-21 ENCOUNTER — Ambulatory Visit (HOSPITAL_BASED_OUTPATIENT_CLINIC_OR_DEPARTMENT_OTHER): Attending: Physician Assistant | Admitting: Physical Therapy

## 2024-02-21 ENCOUNTER — Encounter (HOSPITAL_BASED_OUTPATIENT_CLINIC_OR_DEPARTMENT_OTHER): Admitting: Physical Therapy

## 2024-02-21 DIAGNOSIS — M6281 Muscle weakness (generalized): Secondary | ICD-10-CM | POA: Insufficient documentation

## 2024-02-21 DIAGNOSIS — R29898 Other symptoms and signs involving the musculoskeletal system: Secondary | ICD-10-CM | POA: Insufficient documentation

## 2024-02-21 DIAGNOSIS — M25562 Pain in left knee: Secondary | ICD-10-CM | POA: Diagnosis present

## 2024-02-21 DIAGNOSIS — R2689 Other abnormalities of gait and mobility: Secondary | ICD-10-CM | POA: Insufficient documentation

## 2024-02-21 DIAGNOSIS — M25662 Stiffness of left knee, not elsewhere classified: Secondary | ICD-10-CM | POA: Insufficient documentation

## 2024-02-21 NOTE — Therapy (Signed)
 OUTPATIENT PHYSICAL THERAPY LOWER EXTREMITY TREATMENT   Patient Name: Caleb Kirby MRN: [id number] DOB:November 17, 1965, 58 y.o., male Today's Date: 02/21/2024  END OF SESSION:  PT End of Session - 02/21/24 1706     Visit Number 5    Number of Visits 16    Date for PT Re-Evaluation 03/16/24    Authorization Type [facility]    Authorization Time Period 15 visits approved from [id number]    Authorization - Number of Visits 15    PT Start Time 1615    PT Stop Time 1655    PT Time Calculation (min) 40 min    Activity Tolerance Patient tolerated treatment well    Behavior During Therapy WFL for tasks assessed/performed               Past Medical History:  Diagnosis Date   DVT, lower extremity (HCC)    Epilepsy (HCC)    Factor V Leiden (HCC)    Hypertension    Pulmonary embolism (HCC)    Past Surgical History:  Procedure Laterality Date   TYMPANOPLASTY     Patient Active Problem List   Diagnosis Date Noted   Right inguinal pain 02/14/2019   Hematuria 02/14/2019   Hearing loss of right ear due to cerumen impaction 12/04/2018   HTN, goal below 130/80 10/30/2018   Hypercholesteremia 10/30/2018   Nonintractable epilepsy without status epilepticus (HCC) 10/30/2018   Healthcare maintenance 10/30/2018   Depression, recurrent (HCC) 10/30/2018   BMI 39.0-39.9,adult 10/30/2018    [facility]  REFERRING PROVIDER: Charlotte Cookey, PA-C  REFERRING DIAG: Z47.1 (ICD-10-CM) - Aftercare following joint replacement surgery - L TKA 11/13/23  THERAPY DIAG:  Left knee pain, unspecified chronicity  Stiffness of left knee, not elsewhere classified  Muscle weakness (generalized)  Rationale for Evaluation and Treatment: Rehabilitation  ONSET DATE: DOS 11/13/23  SUBJECTIVE:   SUBJECTIVE STATEMENT: Pt notes that walking is better and is limping less. Pt is still stiff.   PERTINENT HISTORY: HTN, Hx DVT/PE, epilepsy  PAIN:  Are you having pain? No: NPRS  scale:  0/10 at rest Pain location: L knee Pain description: sharp Aggravating factors: bending, initial walking Relieving factors: continued movement  PRECAUTIONS: None  WEIGHT BEARING RESTRICTIONS: No  FALLS:  Has patient fallen in last 6 months? Yes. Number of falls 1  OCCUPATION: [employer]  PLOF: Independent  PATIENT GOALS: get the swelling out of his leg and bend it better so he doesn't have the pain   OBJECTIVE: (objective measures from initial evaluation unless otherwise dated)  PATIENT SURVEYS: LEFS 55/80  COGNITION: Overall cognitive status: Within functional limits for tasks assessed     SENSATION: WFL  EDEMA: LLE gross edema in knee  POSTURE: No Significant postural limitations  PALPATION: Hypomobile patellar glides  LOWER EXTREMITY ROM:  Active ROM Right eval Left eval  Hip flexion    Hip extension    Hip abduction    Hip adduction    Hip internal rotation    Hip external rotation    Knee flexion 130 90  Knee extension  Lacking 8  Ankle dorsiflexion    Ankle plantarflexion    Ankle inversion    Ankle eversion     (Blank rows = not tested) *= pain/symptoms  LOWER EXTREMITY MMT:  MMT Right eval Left eval  Hip flexion 68 34  Hip extension    Hip abduction 52 65  Hip adduction    Hip internal rotation    Hip  external rotation    Knee flexion 52.4 45.1  Knee extension 72 78  Ankle dorsiflexion    Ankle plantarflexion    Ankle inversion    Ankle eversion     (Blank rows = not tested) *= pain/symptoms   FUNCTIONAL TESTS:  5 times sit to stand: 20.11 seconds without UE support, relies RLE>LLE Stairs: 7 inch, alternating ascending with LLE circumduction and increased effort required, descending step too initially, able to perform alternating but lacking knee flexion ROM and eccentric strength and control  GAIT: Distance walked: 100 feet Assistive device utilized: None Level of assistance: Complete Independence Comments: antalgic  on LLE, lacking TKE, limited knee flexion/extension ROM   TODAY'S TREATMENT:                                                                                                                              DATE:   6/3  Recumbent bike half revs 10s holds 6 min TKE mob L knee with tibial ER grade III-IV 109 flexion -1 ext Calf stretch with quad set 5s 2x10 3lbs OP Sidestepping YTB at ankles 33ft 3x Toe walking 36ft 2x STS 3x8 chair height) LAQ blue 3x8    5/16  Recumbent bike half revs 10s holds 6 min  Calf stretch with quad set 5s 2x12 Bridge with Abd holds GTB 5s 10x Seated AAROM flexion 10s 15x STS from tall table with GTB 2x10 Shuttle leg press 75lbs 3x8 (pause at bottom at max tolerated knee flexion)      Treatment                            5/10: Blank lines following charge title = not provided on this treatment date.   Manual:  TPDN No Rolller & STM to Lt hip and lateral thigh Passive HS & ITB stretch There-ex: Supine HS & ITB stretch with strap Supine AHSS for knee flexion Seated self knee flexion mob SLS Lateral step up 2" Heel raises with quad/glut set There-Act:  Self Care:  Nuro-Re-ed:  Gait Training:    Treatment                            5/5: Blank lines following charge title = not provided on this treatment date.   Manual:  TPDN No Roller to hamstrings & gastroc There-ex: Nu step 5 min L5 Quad set to SLR for TKE SL hip abd x30 Bridge 5*5 with feet moving closer each set Seated hamstring stretch (achieves -3 deg) TKE blue tband SLS with quad/glut set There-Act:  Self Care:  Nuro-Re-ed:  Gait Training: Full extension at heel strike Step over hurdle for knee flexion Fwd and retro stepping    PATIENT EDUCATION:  Education details: Patient educated on exam findings, POC, scope of PT, HEP, relevant anatomy, knee positioning. Person educated: Patient Education method: Explanation, Demonstration, and Handouts  Education  comprehension: verbalized understanding, returned demonstration, verbal cues required, and tactile cues required  HOME EXERCISE PROGRAM: Access Code: [email or web address] URL: [email or web address]   ASSESSMENT:  CLINICAL IMPRESSION: Pt able to progress flexion ROM very well and is -1 of ext by end of session. Pt advised on prioritizing knee ext at home and regularly throughout the day to avoid excessive extension stiffness. HEP updated today to include more quad strengthen and standing exercise. No pain noted during session. Consider balance at next as well as use of gym equipment for loaded flexion.   OBJECTIVE IMPAIRMENTS: Abnormal gait, decreased activity tolerance, decreased balance, decreased endurance, decreased mobility, difficulty walking, decreased ROM, decreased strength, increased edema, increased muscle spasms, improper body mechanics, and pain.   ACTIVITY LIMITATIONS: carrying, lifting, bending, standing, squatting, sleeping, stairs, transfers, bathing, dressing, hygiene/grooming, locomotion level, and caring for others  PARTICIPATION LIMITATIONS: meal prep, cleaning, laundry, shopping, community activity, occupation, and yard work  PERSONAL FACTORS: Fitness, Time since onset of injury/illness/exacerbation, and 3+ comorbidities: HTN, Hx DVT/PE, epilepsy are also affecting patient's functional outcome.   REHAB POTENTIAL: Good  CLINICAL DECISION MAKING: Evolving/moderate complexity  EVALUATION COMPLEXITY: Moderate   GOALS: Goals reviewed with patient? Yes  SHORT TERM GOALS: Target date: 02/17/2024    Patient will be independent with HEP in order to improve functional outcomes. Baseline: Goal status: INITIAL  2.  Patient will report at least 25% improvement in symptoms for improved quality of life. Baseline: Goal status: INITIAL    LONG TERM GOALS: Target date: 03/16/2024    Patient will report at least 75% improvement in symptoms for improved quality of  life. Baseline:  Goal status: INITIAL  2.  Patient will improve LEFSscore by at least 9 points in order to indicate improved tolerance to activity. Baseline: 55/80 Goal status: INITIAL  3.  Patient will be able to navigate stairs with reciprocal pattern without compensation in order to demonstrate improved LE strength. Baseline:  Goal status: INITIAL  4.  Patient will improve ROM for L knee extension/flexion to 0-115 degrees to improve squatting, and other functional mobility. Baseline:  Goal status: INITIAL  5.  Patient will be able to complete 5x STS in under 12 seconds in order to  demonstrate improved functional strength. Baseline:  Goal status: INITIAL  6.  Patient will demonstrate improvement of 10# with HHD in all tested musculature as evidence of improved strength to assist with stair ambulation and gait.   Baseline:  Goal status: INITIAL   PLAN:  PT FREQUENCY: 2x/week  PT DURATION: 8 weeks  PLANNED INTERVENTIONS: 97164- PT Re-evaluation, 97110-Therapeutic exercises, 97530- Therapeutic activity, 97112- Neuromuscular re-education, 97535- Self Care, 14782- Manual therapy, [phone]- Gait training, [phone]- Orthotic Fit/training, [phone]- Canalith repositioning, [id number]- Aquatic Therapy, [phone]- Splinting, Patient/Family education, Balance training, Stair training, Taping, Dry Needling, Joint mobilization, Joint manipulation, Spinal manipulation, Spinal mobilization, Scar mobilization, and DME instructions.  PLAN FOR NEXT SESSION: progress L knee mobility and strength, manual for edema and mobility  Silver Dross PT, DPT 02/21/24 5:08 PM

## 2024-02-24 ENCOUNTER — Ambulatory Visit (HOSPITAL_BASED_OUTPATIENT_CLINIC_OR_DEPARTMENT_OTHER): Admitting: Physical Therapy

## 2024-02-28 ENCOUNTER — Encounter (HOSPITAL_BASED_OUTPATIENT_CLINIC_OR_DEPARTMENT_OTHER): Admitting: Physical Therapy

## 2024-03-02 ENCOUNTER — Encounter (HOSPITAL_BASED_OUTPATIENT_CLINIC_OR_DEPARTMENT_OTHER): Payer: Self-pay | Admitting: Physical Therapy

## 2024-03-02 ENCOUNTER — Ambulatory Visit (HOSPITAL_BASED_OUTPATIENT_CLINIC_OR_DEPARTMENT_OTHER): Admitting: Physical Therapy

## 2024-03-02 DIAGNOSIS — M25562 Pain in left knee: Secondary | ICD-10-CM

## 2024-03-02 DIAGNOSIS — M6281 Muscle weakness (generalized): Secondary | ICD-10-CM

## 2024-03-02 DIAGNOSIS — R2689 Other abnormalities of gait and mobility: Secondary | ICD-10-CM

## 2024-03-02 DIAGNOSIS — R29898 Other symptoms and signs involving the musculoskeletal system: Secondary | ICD-10-CM

## 2024-03-02 DIAGNOSIS — M25662 Stiffness of left knee, not elsewhere classified: Secondary | ICD-10-CM

## 2024-03-02 NOTE — Therapy (Signed)
 OUTPATIENT PHYSICAL THERAPY LOWER EXTREMITY TREATMENT   Patient Name: Caleb Kirby MRN: [id number] DOB:1965/10/19, 58 y.o., male Today's Date: 03/02/2024  END OF SESSION:  PT End of Session - 03/02/24 0933     Visit Number 6    Number of Visits 16    Date for PT Re-Evaluation 03/16/24    Authorization Type [employer]    Authorization Time Period 15 visits approved from [id number]    Authorization - Visit Number 6    Authorization - Number of Visits 15    PT Start Time [phone]    PT Stop Time 1014    PT Time Calculation (min) 40 min    Activity Tolerance Patient tolerated treatment well    Behavior During Therapy WFL for tasks assessed/performed            Past Medical History:  Diagnosis Date   DVT, lower extremity (HCC)    Epilepsy (HCC)    Factor V Leiden (HCC)    Hypertension    Pulmonary embolism (HCC)    Past Surgical History:  Procedure Laterality Date   TYMPANOPLASTY     Patient Active Problem List   Diagnosis Date Noted   Right inguinal pain 02/14/2019   Hematuria 02/14/2019   Hearing loss of right ear due to cerumen impaction 12/04/2018   HTN, goal below 130/80 10/30/2018   Hypercholesteremia 10/30/2018   Nonintractable epilepsy without status epilepticus (HCC) 10/30/2018   Healthcare maintenance 10/30/2018   Depression, recurrent (HCC) 10/30/2018   BMI 39.0-39.9,adult 10/30/2018    [facility]  REFERRING PROVIDER: Charlotte Cookey, PA-C  REFERRING DIAG: Z47.1 (ICD-10-CM) - Aftercare following joint replacement surgery - L TKA 11/13/23  THERAPY DIAG:  Left knee pain, unspecified chronicity  Stiffness of left knee, not elsewhere classified  Muscle weakness (generalized)  Other abnormalities of gait and mobility  Other symptoms and signs involving the musculoskeletal system  Rationale for Evaluation and Treatment: Rehabilitation  ONSET DATE: DOS 11/13/23  SUBJECTIVE:   SUBJECTIVE STATEMENT: Pt states knee is stiff.  HEP going well.    PERTINENT HISTORY: HTN, Hx DVT/PE, epilepsy  PAIN:  Are you having pain? No: NPRS scale:  0/10 at rest Pain location: L knee Pain description: sharp Aggravating factors: bending, initial walking Relieving factors: continued movement  PRECAUTIONS: None  WEIGHT BEARING RESTRICTIONS: No  FALLS:  Has patient fallen in last 6 months? Yes. Number of falls 1  OCCUPATION: [employer] office  PLOF: Independent  PATIENT GOALS: get the swelling out of his leg and bend it better so he doesn't have the pain   OBJECTIVE: (objective measures from initial evaluation unless otherwise dated)  PATIENT SURVEYS: LEFS 55/80  COGNITION: Overall cognitive status: Within functional limits for tasks assessed     SENSATION: WFL  EDEMA: LLE gross edema in knee  POSTURE: No Significant postural limitations  PALPATION: Hypomobile patellar glides  LOWER EXTREMITY ROM:  Active ROM Right eval Left eval Left 03/02/24  Hip flexion     Hip extension     Hip abduction     Hip adduction     Hip internal rotation     Hip external rotation     Knee flexion 130 90 113 improves to 117  Knee extension  Lacking 8 Lacking 2  Ankle dorsiflexion     Ankle plantarflexion     Ankle inversion     Ankle eversion      (Blank rows = not tested) *= pain/symptoms  LOWER EXTREMITY MMT:  MMT Right eval Left eval  Hip flexion 68 34  Hip extension    Hip abduction 52 65  Hip adduction    Hip internal rotation    Hip external rotation    Knee flexion 52.4 45.1  Knee extension 72 78  Ankle dorsiflexion    Ankle plantarflexion    Ankle inversion    Ankle eversion     (Blank rows = not tested) *= pain/symptoms   FUNCTIONAL TESTS:  5 times sit to stand: 20.11 seconds without UE support, relies RLE>LLE Stairs: 7 inch, alternating ascending with LLE circumduction and increased effort required, descending step too initially, able to perform alternating but lacking knee flexion ROM and  eccentric strength and control  GAIT: Distance walked: 100 feet Assistive device utilized: None Level of assistance: Complete Independence Comments: antalgic on LLE, lacking TKE, limited knee flexion/extension ROM   TODAY'S TREATMENT:                                                                                                                              DATE:  03/02/24 Recumbent bike 5 minutes  Heel slides with strap 10 x 10 second holds Manual: TKE mob L knee with tibial ER grade III-IV Contract relax for flexion Leg press 90# 3 x 10 Lunge 1 x 10  6/3  Recumbent bike half revs 10s holds 6 min TKE mob L knee with tibial ER grade III-IV 109 flexion -1 ext Calf stretch with quad set 5s 2x10 3lbs OP Sidestepping YTB at ankles 59ft 3x Toe walking 57ft 2x STS 3x8 chair height) LAQ blue 3x8    5/16  Recumbent bike half revs 10s holds 6 min  Calf stretch with quad set 5s 2x12 Bridge with Abd holds GTB 5s 10x Seated AAROM flexion 10s 15x STS from tall table with GTB 2x10 Shuttle leg press 75lbs 3x8 (pause at bottom at max tolerated knee flexion)      Treatment                            5/10: Blank lines following charge title = not provided on this treatment date.   Manual:  TPDN No Rolller & STM to Lt hip and lateral thigh Passive HS & ITB stretch There-ex: Supine HS & ITB stretch with strap Supine AHSS for knee flexion Seated self knee flexion mob SLS Lateral step up 2 Heel raises with quad/glut set There-Act:  Self Care:  Nuro-Re-ed:  Gait Training:    Treatment                            5/5: Blank lines following charge title = not provided on this treatment date.   Manual:  TPDN No Roller to hamstrings & gastroc There-ex: Nu step 5 min L5 Quad set to SLR for TKE SL hip abd x30 Bridge 5*5 with feet  moving closer each set Seated hamstring stretch (achieves -3 deg) TKE blue tband SLS with quad/glut set There-Act:  Self  Care:  Nuro-Re-ed:  Gait Training: Full extension at heel strike Step over hurdle for knee flexion Fwd and retro stepping    PATIENT EDUCATION:  Education details: Patient educated on exam findings, POC, scope of PT, HEP, relevant anatomy, knee positioning. Person educated: Patient Education method: Explanation, Demonstration, and Handouts Education comprehension: verbalized understanding, returned demonstration, verbal cues required, and tactile cues required  HOME EXERCISE PROGRAM: Access Code: [email or web address] URL: [email or web address]   ASSESSMENT:  CLINICAL IMPRESSION: Patient with ROM from lacking 2 to 113 following bike warm up. Improves to 0 to 117 with stretches and manual. Continued with quad strengthening which is tolerated well. Patient will continue to benefit from physical therapy in order to improve function and reduce impairment.   OBJECTIVE IMPAIRMENTS: Abnormal gait, decreased activity tolerance, decreased balance, decreased endurance, decreased mobility, difficulty walking, decreased ROM, decreased strength, increased edema, increased muscle spasms, improper body mechanics, and pain.   ACTIVITY LIMITATIONS: carrying, lifting, bending, standing, squatting, sleeping, stairs, transfers, bathing, dressing, hygiene/grooming, locomotion level, and caring for others  PARTICIPATION LIMITATIONS: meal prep, cleaning, laundry, shopping, community activity, occupation, and yard work  PERSONAL FACTORS: Fitness, Time since onset of injury/illness/exacerbation, and 3+ comorbidities: HTN, Hx DVT/PE, epilepsy are also affecting patient's functional outcome.   REHAB POTENTIAL: Good  CLINICAL DECISION MAKING: Evolving/moderate complexity  EVALUATION COMPLEXITY: Moderate   GOALS: Goals reviewed with patient? Yes  SHORT TERM GOALS: Target date: 02/17/2024    Patient will be independent with HEP in order to improve functional outcomes. Baseline: Goal status:  INITIAL  2.  Patient will report at least 25% improvement in symptoms for improved quality of life. Baseline: Goal status: INITIAL    LONG TERM GOALS: Target date: 03/16/2024    Patient will report at least 75% improvement in symptoms for improved quality of life. Baseline:  Goal status: INITIAL  2.  Patient will improve LEFSscore by at least 9 points in order to indicate improved tolerance to activity. Baseline: 55/80 Goal status: INITIAL  3.  Patient will be able to navigate stairs with reciprocal pattern without compensation in order to demonstrate improved LE strength. Baseline:  Goal status: INITIAL  4.  Patient will improve ROM for L knee extension/flexion to 0-115 degrees to improve squatting, and other functional mobility. Baseline:  Goal status: INITIAL  5.  Patient will be able to complete 5x STS in under 12 seconds in order to  demonstrate improved functional strength. Baseline:  Goal status: INITIAL  6.  Patient will demonstrate improvement of 10# with HHD in all tested musculature as evidence of improved strength to assist with stair ambulation and gait.   Baseline:  Goal status: INITIAL   PLAN:  PT FREQUENCY: 2x/week  PT DURATION: 8 weeks  PLANNED INTERVENTIONS: 97164- PT Re-evaluation, 97110-Therapeutic exercises, 97530- Therapeutic activity, 97112- Neuromuscular re-education, 97535- Self Care, 40981- Manual therapy, [phone]- Gait training, [phone]- Orthotic Fit/training, [phone]- Canalith repositioning, [id number]- Aquatic Therapy, [phone]- Splinting, Patient/Family education, Balance training, Stair training, Taping, Dry Needling, Joint mobilization, Joint manipulation, Spinal manipulation, Spinal mobilization, Scar mobilization, and DME instructions.  PLAN FOR NEXT SESSION: progress L knee mobility and strength, manual for edema and mobility   Beather Liming, PT, DPT 03/02/2024, 9:34 AM

## 2024-03-06 ENCOUNTER — Encounter (HOSPITAL_BASED_OUTPATIENT_CLINIC_OR_DEPARTMENT_OTHER): Admitting: Physical Therapy

## 2024-03-09 ENCOUNTER — Ambulatory Visit (HOSPITAL_BASED_OUTPATIENT_CLINIC_OR_DEPARTMENT_OTHER): Admitting: Physical Therapy

## 2024-03-09 ENCOUNTER — Encounter (HOSPITAL_BASED_OUTPATIENT_CLINIC_OR_DEPARTMENT_OTHER): Payer: Self-pay

## 2024-03-09 ENCOUNTER — Encounter (HOSPITAL_BASED_OUTPATIENT_CLINIC_OR_DEPARTMENT_OTHER): Payer: Self-pay | Admitting: Physical Therapy

## 2024-03-09 NOTE — Therapy (Signed)
 [facility] Rehabilitation at [facility] [address] [place], [place], [address] Phone: [phone]   Fax:  [phone]  Patient Details  Name: Caleb Kirby MRN: [id number] Date of Birth: [birth year] Referring Provider:  No ref. provider found  Encounter Date: 03/09/2024  PHYSICAL THERAPY DISCHARGE SUMMARY  Visits from Start of Care: 6  Current functional level related to goals / functional outcomes: Patient with ROM from lacking 2 to 117 at end of last session (03/02/24). Unable to reassess goals as patient has not returned and is requesting d/c from PT as he feels he does not need any more PT.    Remaining deficits: Unable to reassess as patient has not returned   Education / Equipment: HEP   Patient agrees to discharge. Patient goals were unable to reassessed as he has not returned. Patient is being discharged due to being pleased with the current functional level.   8:30 AM, 03/09/24 Beather Liming PT, DPT Physical Therapist at [facility] Outpatient Rehabilitation at [facility] [address] [place], [place], [address] Phone: [phone]   Fax:  [phone]

## 2024-03-13 ENCOUNTER — Encounter (HOSPITAL_BASED_OUTPATIENT_CLINIC_OR_DEPARTMENT_OTHER): Admitting: Physical Therapy

## 2024-03-16 ENCOUNTER — Encounter (HOSPITAL_BASED_OUTPATIENT_CLINIC_OR_DEPARTMENT_OTHER): Admitting: Physical Therapy

## 2024-07-06 ENCOUNTER — Ambulatory Visit (INDEPENDENT_AMBULATORY_CARE_PROVIDER_SITE_OTHER)

## 2024-07-06 VITALS — BP 145/88 | Ht 72.0 in

## 2024-07-06 DIAGNOSIS — D6851 Activated protein C resistance: Secondary | ICD-10-CM | POA: Diagnosis not present

## 2024-07-06 DIAGNOSIS — F339 Major depressive disorder, recurrent, unspecified: Secondary | ICD-10-CM

## 2024-07-06 DIAGNOSIS — E7800 Pure hypercholesterolemia, unspecified: Secondary | ICD-10-CM | POA: Diagnosis not present

## 2024-07-06 DIAGNOSIS — I10 Essential (primary) hypertension: Secondary | ICD-10-CM

## 2024-07-06 DIAGNOSIS — Z130 Encounter for screening for diseases of the blood and blood-forming organs and certain disorders involving the immune mechanism: Secondary | ICD-10-CM

## 2024-07-06 DIAGNOSIS — Z1159 Encounter for screening for other viral diseases: Secondary | ICD-10-CM

## 2024-07-06 DIAGNOSIS — G40909 Epilepsy, unspecified, not intractable, without status epilepticus: Secondary | ICD-10-CM

## 2024-07-06 DIAGNOSIS — N189 Chronic kidney disease, unspecified: Secondary | ICD-10-CM | POA: Insufficient documentation

## 2024-07-06 NOTE — Assessment & Plan Note (Signed)
 Managed with phenytoin , 200 mg ER BID. Followed by the [facility].

## 2024-07-06 NOTE — Assessment & Plan Note (Signed)
 Lipid panel pending with labs. Continue atorvastatin 10 mg daily. Will adjust as indicated per lipid panel results

## 2024-07-06 NOTE — Assessment & Plan Note (Signed)
 BP Goal <130/80. Above goal in office and on recheck, likely situational. Typically well-controlled at home. - Continue losartan 25 mg and hydrochlorothiazide 25 mg.  - If BP remains elevated, consider increasing losartan to 50 mg.

## 2024-07-06 NOTE — Assessment & Plan Note (Signed)
 Managed with venlafaxine 150 mg daily.

## 2024-07-06 NOTE — Patient Instructions (Signed)
 VISIT SUMMARY: You had a routine adult wellness visit to establish care closer to home and update your medical records. We discussed your ongoing health issues including hypertension, chronic kidney disease, chronic left hip pain, Factor V Leiden thrombophilia, depression, hyperlipidemia, and epilepsy.  YOUR PLAN: HYPERTENSION: Your blood pressure tends to be elevated in office settings but is typically well-controlled at home. -We will recheck your blood pressure before you leave the office. -If your blood pressure is elevated at recheck, please monitor it at home a couple of times a week.  CHRONIC KIDNEY DISEASE: You have chronic kidney disease due to a past hockey injury resulting in a smaller kidney. -We will monitor your kidney function through blood work.  CHRONIC LEFT HIP PAIN: You have ongoing left hip pain, possibly related to degenerative changes. -We will request your x-ray records from the [facility] for review.  FACTOR V LEIDEN THROMBOPHILIA WITH DEEP VEIN THROMBOSIS: You have a history of deep vein thrombosis, managed with warfarin 7.5 mg daily. -Continue taking warfarin 7.5 mg daily as prescribed.  DEPRESSION: You have a history of depression, managed with venlafaxine 150 mg daily. -Continue taking venlafaxine 150 mg daily as prescribed.  HYPERLIPIDEMIA: You have a history of elevated cholesterol, managed with atorvastatin 10 mg daily. -Continue taking atorvastatin 10 mg daily as prescribed.  EPILEPSY: You have a history of epilepsy, managed with phenytoin . -Continue taking phenytoin  as prescribed.  GENERAL HEALTH MAINTENANCE: Routine adult wellness visit to establish care and update medical records. -Order blood work including kidney function, liver function, A1c, thyroid  panel, cholesterol panel, blood counts, and hepatitis C screening.  If you have any problems before your next visit feel free to message me via MyChart (minor issues or questions) or call the office, otherwise  you may reach out to schedule an office visit.  Thank you! Saddie Sacks, PA-C

## 2024-07-06 NOTE — Assessment & Plan Note (Signed)
 Chronic kidney disease due to past hockey injury resulting in a smaller kidney. - Rechecking kidney function today with labs - Will cont to monitor.

## 2024-07-06 NOTE — Assessment & Plan Note (Signed)
 Deep vein thrombosis, last episode approximately nine years ago. Managed with warfarin 7.5 mg daily. - INRs followed by the [facility]

## 2024-07-06 NOTE — Progress Notes (Signed)
 New Patient Office Visit  Subjective    Patient ID: Caleb Kirby, male    DOB: September 10, 1966  Age: 58 y.o. MRN: [id number]  CC:  Chief Complaint  Patient presents with   New Patient (Initial Visit)    Establish Care   History of Present Illness   Caleb Kirby is a 58 year old male who presents to establish care closer to home. 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   Anticoagulation and thromboembolic disease - History of significant deep vein thrombosis approximately nine years ago - Factor V Leiden mutation contributing to hypercoagulability - Currently on warfarin 7.5 mg daily for anticoagulation - [facility]   Hypertension - Blood pressure tends to be elevated in office settings - Home blood pressure readings typically in the high 120s/80s at home - Does not regularly monitor blood pressure at home but has done so in the past - Currently taking losartan 25 mg daily and hydrochlorothiazide 25 mg daily  Dyslipidemia - History of elevated cholesterol - Currently taking atorvastatin 10 mg daily  Depressive symptoms - History of depression - Currently taking venlafaxine 150 mg daily which works well for him  Seizure disorder - History of epilepsy - Currently managed with phenytoin  and [facility]    Screenings:  Colon Cancer: Gets these done through the [facility] and has one scheduled for this year  Lung Cancer: N/A Breast Cancer: N/A Diabetes: Checking A1c with labs  HLD: Checking lipid panel with labs The ASCVD Risk score (Arnett DK, et al., 2019) failed to calculate for the following reasons:   Cannot find a previous HDL lab   Cannot find a previous total cholesterol lab   Outpatient Encounter Medications as of 07/06/2024  Medication Sig   atorvastatin (LIPITOR) 10 MG tablet Take 1 tablet by mouth daily.    hydrochlorothiazide (HYDRODIURIL) 25 MG tablet Take 1 tablet by mouth daily.   losartan (COZAAR) 50 MG tablet Take 25 mg by mouth daily.   phenytoin  (DILANTIN ) 200 MG ER capsule Take 200 mg by mouth 2 (two) times daily.   venlafaxine XR (EFFEXOR-XR) 150 MG 24 hr capsule Take 150 mg by mouth daily with breakfast.   warfarin (COUMADIN) 5 MG tablet Take 7.5 mg by mouth daily.   No facility-administered encounter medications on file as of 07/06/2024.    Past Medical History:  Diagnosis Date   DVT, lower extremity (HCC)    Epilepsy (HCC)    Factor V Leiden    Hypertension    Pulmonary embolism (HCC)     Past Surgical History:  Procedure Laterality Date   TYMPANOPLASTY      Family History  Problem Relation Age of Onset   Heart attack Mother    Alcohol abuse Father    Cancer Sister        GI   Cancer Brother        colon   Cancer Maternal Grandmother        metastatic    Social History   Socioeconomic History   Marital status: Married    Spouse name: Not on file   Number of children: Not on file   Years of education: Not on file   Highest education level: Associate degree: occupational, Scientist, product/process development, or vocational program  Occupational History  Not on file  Tobacco Use   Smoking status: Former    Current packs/day: 0.00    Average packs/day: 1 pack/day for 20.0 years (20.0 ttl pk-yrs)    Types: Cigarettes    Start date: 07/21/1977    Quit date: 07/21/1997    Years since quitting: 26.9   Smokeless tobacco: Never  Vaping Use   Vaping status: Never Used  Substance and Sexual Activity   Alcohol use: Yes    Alcohol/week: 1.0 standard drink of alcohol    Types: 1 Cans of beer per week   Drug use: Not Currently   Sexual activity: Yes    Birth control/protection: Surgical  Other Topics Concern   Not on file  Social History Narrative   Not on file   Social Drivers of Health   Financial Resource Strain: Low Risk  (07/05/2024)   Overall Financial Resource Strain  (CARDIA)    Difficulty of Paying Living Expenses: Not very hard  Food Insecurity: No Food Insecurity (07/05/2024)   Hunger Vital Sign    Worried About Running Out of Food in the Last Year: Never true    Ran Out of Food in the Last Year: Never true  Transportation Needs: No Transportation Needs (07/05/2024)   PRAPARE - Administrator, Civil Service (Medical): No    Lack of Transportation (Non-Medical): No  Physical Activity: Sufficiently Active (07/05/2024)   Exercise Vital Sign    Days of Exercise per Week: 5 days    Minutes of Exercise per Session: 150+ min  Stress: No Stress Concern Present (07/05/2024)   [employer] of Occupational Health - Occupational Stress Questionnaire    Feeling of Stress: Not at all  Social Connections: Socially Isolated (07/05/2024)   Social Connection and Isolation Panel    Frequency of Communication with Friends and Family: Never    Frequency of Social Gatherings with Friends and Family: Never    Attends Religious Services: Never    Database administrator or Organizations: No    Attends Engineer, structural: Not on file    Marital Status: Married  Catering manager Violence: Not on file    ROS  Per HPI      Objective    BP (!) 145/88   Ht 6' (1.829 m)   BMI 37.30 kg/m   Physical Exam Constitutional:      General: He is not in acute distress.    Appearance: Normal appearance.  Cardiovascular:     Rate and Rhythm: Normal rate and regular rhythm.     Heart sounds: Normal heart sounds. No murmur heard.    No friction rub. No gallop.  Pulmonary:     Effort: Pulmonary effort is normal. No respiratory distress.     Breath sounds: Normal breath sounds.  Musculoskeletal:        General: No swelling.     Cervical back: Neck supple.  Lymphadenopathy:     Cervical: No cervical adenopathy.  Skin:    General: Skin is warm and dry.  Neurological:     General: No focal deficit present.     Mental Status: He is alert.   Psychiatric:        Mood and Affect: Mood normal.        Behavior: Behavior normal.        Thought Content: Thought content normal.          Assessment & Plan:   Factor V Leiden Assessment & Plan: Deep vein thrombosis,  last episode approximately nine years ago. Managed with warfarin 7.5 mg daily. - [facility]   Screening for endocrine, nutritional, metabolic and immunity disorder -     VITAMIN D 25 Hydroxy (Vit-D Deficiency, Fractures); Future -     CBC with Differential/Platelet; Future -     Hemoglobin A1c; Future -     Comprehensive metabolic panel with GFR; Future -     Lipid panel; Future -     TSH; Future  Screening for viral disease -     Hepatitis C antibody; Future  HTN, goal below 130/80 Assessment & Plan: BP Goal <130/80. Above goal in office and on recheck, likely situational. Typically well-controlled at home. - Continue losartan 25 mg and hydrochlorothiazide 25 mg.  - If BP remains elevated, consider increasing losartan to 50 mg.    Depression, recurrent Assessment & Plan: Managed with venlafaxine 150 mg daily.   Hypercholesteremia Assessment & Plan: Lipid panel pending with labs. Continue atorvastatin 10 mg daily. Will adjust as indicated per lipid panel results    Nonintractable epilepsy without status epilepticus, unspecified epilepsy type [facility]) Assessment & Plan: Managed with phenytoin , 200 mg ER BID. Followed by the [facility].   Chronic kidney disease, unspecified CKD stage Assessment & Plan: Chronic kidney disease due to past hockey injury resulting in a smaller kidney. - Rechecking kidney function today with labs - Will cont to monitor.     Return in about 6 months (around 01/04/2025) for HTN, HLD.   Saddie JULIANNA Sacks, PA-C

## 2024-07-07 LAB — COMPREHENSIVE METABOLIC PANEL WITH GFR
ALT: 29 IU/L (ref 0–44)
AST: 20 IU/L (ref 0–40)
Albumin: 4 g/dL (ref 3.8–4.9)
Alkaline Phosphatase: 186 IU/L — ABNORMAL HIGH (ref 47–123)
BUN/Creatinine Ratio: 14 (ref 9–20)
BUN: 16 mg/dL (ref 6–24)
Bilirubin Total: 0.4 mg/dL (ref 0.0–1.2)
CO2: 27 mmol/L (ref 20–29)
Calcium: 9.1 mg/dL (ref 8.7–10.2)
Chloride: 101 mmol/L (ref 96–106)
Creatinine, Ser: 1.12 mg/dL (ref 0.76–1.27)
Globulin, Total: 2.8 g/dL (ref 1.5–4.5)
Glucose: 83 mg/dL (ref 70–99)
Potassium: 3.9 mmol/L (ref 3.5–5.2)
Sodium: 142 mmol/L (ref 134–144)
Total Protein: 6.8 g/dL (ref 6.0–8.5)
eGFR: 76 mL/min/1.73 (ref >59)

## 2024-07-07 LAB — HEMOGLOBIN A1C
Est. average glucose Bld gHb Est-mCnc: 111 mg/dL
Hgb A1c MFr Bld: 5.5 % (ref 4.8–5.6)

## 2024-07-07 LAB — CBC WITH DIFFERENTIAL/PLATELET
Basophils Absolute: 0.1 x10E3/uL (ref 0.0–0.2)
Basos: 1 %
EOS (ABSOLUTE): 0.1 x10E3/uL (ref 0.0–0.4)
Eos: 2 %
Hematocrit: 40.3 % (ref 37.5–51.0)
Hemoglobin: 13.7 g/dL (ref 13.0–17.7)
Immature Grans (Abs): 0 x10E3/uL (ref 0.0–0.1)
Immature Granulocytes: 0 %
Lymphocytes Absolute: 2.3 x10E3/uL (ref 0.7–3.1)
Lymphs: 41 %
MCH: 30.8 pg (ref 26.6–33.0)
MCHC: 34 g/dL (ref 31.5–35.7)
MCV: 91 fL (ref 79–97)
Monocytes Absolute: 0.5 x10E3/uL (ref 0.1–0.9)
Monocytes: 8 %
Neutrophils Absolute: 2.6 x10E3/uL (ref 1.4–7.0)
Neutrophils: 48 %
Platelets: 249 x10E3/uL (ref 150–450)
RBC: 4.45 x10E6/uL (ref 4.14–5.80)
RDW: 12.6 % (ref 11.6–15.4)
WBC: 5.5 x10E3/uL (ref 3.4–10.8)

## 2024-07-07 LAB — TSH: TSH: 1.39 u[IU]/mL (ref 0.450–4.500)

## 2024-07-07 LAB — LIPID PANEL
Chol/HDL Ratio: 4.3 ratio (ref 0.0–5.0)
Cholesterol, Total: 205 mg/dL — ABNORMAL HIGH (ref 100–199)
HDL: 48 mg/dL (ref >39)
LDL Chol Calc (NIH): 128 mg/dL — ABNORMAL HIGH (ref 0–99)
Triglycerides: 164 mg/dL — ABNORMAL HIGH (ref 0–149)
VLDL Cholesterol Cal: 29 mg/dL (ref 5–40)

## 2024-07-07 LAB — VITAMIN D 25 HYDROXY (VIT D DEFICIENCY, FRACTURES): Vit D, 25-Hydroxy: 16.7 ng/mL — ABNORMAL LOW (ref 30.0–100.0)

## 2024-07-07 LAB — HEPATITIS C ANTIBODY: Hep C Virus Ab: REACTIVE — AB

## 2024-07-09 ENCOUNTER — Ambulatory Visit: Payer: Self-pay

## 2024-07-09 DIAGNOSIS — R7689 Other specified abnormal immunological findings in serum: Secondary | ICD-10-CM

## 2024-07-09 MED ORDER — CHOLECALCIFEROL 1.25 MG (50000 UT) PO TABS: 50000.0000 [IU] | ORAL_TABLET | ORAL | 0 refills | Status: AC

## 2024-07-10 NOTE — Telephone Encounter (Signed)
 Tried calling the patient. No voicemail set up. I will sent a MyChart message. Patient needs to get scheduled for a lab appointment.

## 2025-01-04 ENCOUNTER — Ambulatory Visit
